# Patient Record
Sex: Male | Born: 1960 | Race: White | Hispanic: No | Marital: Married | Smoking: Current some day smoker
Health system: Southern US, Community
[De-identification: ages and names within clinical notes are randomized; demographics above are authoritative.]

## PROBLEM LIST (undated history)

## (undated) DIAGNOSIS — I714 Abdominal aortic aneurysm, without rupture, unspecified: Secondary | ICD-10-CM

## (undated) DIAGNOSIS — M51369 Other intervertebral disc degeneration, lumbar region without mention of lumbar back pain or lower extremity pain: Secondary | ICD-10-CM

## (undated) DIAGNOSIS — M5136 Other intervertebral disc degeneration, lumbar region: Secondary | ICD-10-CM

## (undated) DIAGNOSIS — M359 Systemic involvement of connective tissue, unspecified: Secondary | ICD-10-CM

## (undated) DIAGNOSIS — I10 Essential (primary) hypertension: Secondary | ICD-10-CM

## (undated) HISTORY — PX: OTHER SURGICAL HISTORY: SHX169

## (undated) HISTORY — PX: UMBILICAL HERNIA REPAIR: SHX196

---

## 2015-11-03 ENCOUNTER — Emergency Department (HOSPITAL_COMMUNITY): Payer: BLUE CROSS/BLUE SHIELD

## 2015-11-03 ENCOUNTER — Encounter (HOSPITAL_COMMUNITY): Payer: Self-pay | Admitting: *Deleted

## 2015-11-03 ENCOUNTER — Emergency Department (HOSPITAL_COMMUNITY)
Admission: EM | Admit: 2015-11-03 | Discharge: 2015-11-03 | Disposition: A | Discharge status: Home / Self Care | Payer: BLUE CROSS/BLUE SHIELD | Attending: Emergency Medicine | Admitting: Emergency Medicine

## 2015-11-03 DIAGNOSIS — Z880 Allergy status to penicillin: Secondary | ICD-10-CM | POA: Diagnosis not present

## 2015-11-03 DIAGNOSIS — F17200 Nicotine dependence, unspecified, uncomplicated: Secondary | ICD-10-CM | POA: Diagnosis not present

## 2015-11-03 DIAGNOSIS — B9789 Other viral agents as the cause of diseases classified elsewhere: Secondary | ICD-10-CM

## 2015-11-03 DIAGNOSIS — R062 Wheezing: Secondary | ICD-10-CM

## 2015-11-03 DIAGNOSIS — J069 Acute upper respiratory infection, unspecified: Secondary | ICD-10-CM | POA: Diagnosis not present

## 2015-11-03 DIAGNOSIS — R05 Cough: Secondary | ICD-10-CM | POA: Diagnosis present

## 2015-11-03 MED ORDER — BENZONATATE 100 MG PO CAPS
100.0000 mg | ORAL_CAPSULE | Freq: Once | ORAL | Status: AC
  Administered 2015-11-03: 100 mg via ORAL
  Filled 2015-11-03: qty 1

## 2015-11-03 MED ORDER — PREDNISONE 50 MG PO TABS
60.0000 mg | ORAL_TABLET | Freq: Once | ORAL | Status: AC
  Administered 2015-11-03: 60 mg via ORAL
  Filled 2015-11-03: qty 1

## 2015-11-03 MED ORDER — BENZONATATE 100 MG PO CAPS: 100.0000 mg | ORAL_CAPSULE | Freq: Three times a day (TID) | ORAL | Status: DC | PRN

## 2015-11-03 MED ORDER — IPRATROPIUM-ALBUTEROL 0.5-2.5 (3) MG/3ML IN SOLN
3.0000 mL | Freq: Once | RESPIRATORY_TRACT | Status: AC
  Administered 2015-11-03: 3 mL via RESPIRATORY_TRACT
  Filled 2015-11-03: qty 3

## 2015-11-03 MED ORDER — PREDNISONE 20 MG PO TABS: 60.0000 mg | ORAL_TABLET | Freq: Every day | ORAL | Status: DC

## 2015-11-03 MED ORDER — IBUPROFEN 800 MG PO TABS: 800.0000 mg | ORAL_TABLET | Freq: Three times a day (TID) | ORAL | Status: DC | PRN

## 2015-11-03 MED ORDER — ALBUTEROL SULFATE HFA 108 (90 BASE) MCG/ACT IN AERS: 2.0000 | INHALATION_SPRAY | RESPIRATORY_TRACT | Status: DC | PRN

## 2015-11-03 MED ORDER — IBUPROFEN 800 MG PO TABS
800.0000 mg | ORAL_TABLET | Freq: Once | ORAL | Status: AC
  Administered 2015-11-03: 800 mg via ORAL
  Filled 2015-11-03: qty 1

## 2015-11-03 NOTE — ED Notes (Signed)
Pt c/o fever, cough that is non productive that started two weeks ago,

## 2015-11-03 NOTE — ED Notes (Signed)
Patient verbalizes understanding of discharge instructions, prescription medications, home care and follow up care. Patient out of department at this time with family. 

## 2015-11-03 NOTE — ED Provider Notes (Signed)
  CHIEF COMPLAINT: subjective fevers, cough, body aches  HPI: Pt is a 55 y.o. male who has had 2 weeks of dry cough, subjective fevers. Was seen in urgent care and [facility] and prescribed antibiotics which he has completed. He also took hydrocodone cough syrup which he states made him feel "loopy". States she has had some wheezing but no chest pain or shortness of breath.  He denies any vomiting or diarrhea. No sick contacts or recent travel. Did not have influenza vaccination this year.     ROS: See HPI Constitutional:  fever  Eyes: no drainage  ENT: no runny nose   Cardiovascular:  no chest pain  Resp: no SOB  GI: no vomiting GU: no dysuria Integumentary: no rash  Allergy: no hives  Musculoskeletal: no leg swelling  Neurological: no slurred speech ROS otherwise negative  PAST MEDICAL HISTORY/PAST SURGICAL HISTORY:  History reviewed. No pertinent past medical history.  MEDICATIONS:  Prior to Admission medications   Not on File    ALLERGIES:  Allergies  Allergen Reactions  . Codeine   . Penicillins   . Sulfa Antibiotics     SOCIAL HISTORY:  Social History  Substance Use Topics  . Smoking status: Current Some Day Smoker  . Smokeless tobacco: Not on file  . Alcohol Use: Not on file    FAMILY HISTORY: No family history on file.  EXAM: BP 135/91 mmHg  Pulse 73  Temp(Src) 98.2 F (36.8 C) (Oral)  Resp 20  Ht  (1.803 m)  Wt 167 lb (75.751 kg)  BMI 23.30 kg/m2  SpO2 100% CONSTITUTIONAL: Alert and oriented and responds appropriately to questions. Well-appearing; well-nourished HEAD: Normocephalic EYES: Conjunctivae clear, PERRL ENT: normal nose; no rhinorrhea; moist mucous membranes; pharynx without lesions noted NECK: Supple, no meningismus, no LAD  CARD: RRR; S1 and S2 appreciated; no murmurs, no clicks, no rubs, no gallops RESP: Pt has scattered expiratory wheezing is diminished at his bases bilaterally. Normal chest excursion without splinting or  tachypnea; breath sounds  equal bilaterally;  no rhonchi, no rales, no hypoxia or respiratory distress, speaking full sentences ABD/GI: Normal bowel sounds; non-distended; soft, non-tender, no rebound, no guarding, no peritoneal signs BACK:  The back appears normal and is non-tender to palpation, there is no CVA tenderness EXT: Normal ROM in all joints; non-tender to palpation; no edema; normal capillary refill; no cyanosis, no calf tenderness or swelling    SKIN: Normal color for age and race; warm NEURO: Moves all extremities equally, sensation to light touch intact diffusely, cranial nerves II through XII intact PSYCH: The patient's mood and manner are appropriate. Grooming and personal hygiene are appropriate.  MEDICAL DECISION MAKING: patient here with flulike symptoms, suspect viral illness. Chest x-ray shows no infiltrate. Patient did have some expiratory wheezing and diminished aeration at his bases bilaterally that has improved after breathing treatment. I have also treated him symptomatically with Tessalon Perles, ibuprofen, prednisone. He reports feeling much better. No infiltrate on CXR to  suggest he needs to be on antibiotics. He is outside of treatment window for Tamiflu. Have recommended alternating Tylenol and Motrin for fever and pain. Recommended rest, increase fluid intake. Will discharge with albuterol inhaler as well as prednisone burst given he is a smoker and has been wheezing. Discussed return precautions. Provide with outpatient follow-up. He verbalized understanding and is comfortable with this plan.      Layla Maw Chavy Avera, DO 11/03/15 [id number]

## 2015-11-03 NOTE — Discharge Instructions (Signed)
Upper Respiratory Infection, Adult °Most upper respiratory infections (URIs) are a viral infection of the air passages leading to the lungs. A URI affects the nose, throat, and upper air passages. The most common type of URI is nasopharyngitis and is typically referred to as "the common cold." °URIs run their course and usually go away on their own. Most of the time, a URI does not require medical attention, but sometimes a bacterial infection in the upper airways can follow a viral infection. This is called a secondary infection. Sinus and middle ear infections are common types of secondary upper respiratory infections. °Bacterial pneumonia can also complicate a URI. A URI can worsen asthma and chronic obstructive pulmonary disease (COPD). Sometimes, these complications can require emergency medical care and may be life threatening.  °CAUSES °Almost all URIs are caused by viruses. A virus is a type of germ and can spread from one person to another.  °RISKS FACTORS °You may be at risk for a URI if:  °1. You smoke.   °2. You have chronic heart or lung disease. °3. You have a weakened defense (immune) system.   °4. You are very young or very old.   °5. You have nasal allergies or asthma. °6. You work in crowded or poorly ventilated areas. °7. You work in health care facilities or schools. °SIGNS AND SYMPTOMS  °Symptoms typically develop 2-3 days after you come in contact with a cold virus. Most viral URIs last 7-10 days. However, viral URIs from the influenza virus (flu virus) can last 14-18 days and are typically more severe. Symptoms may include:  °1. Runny or stuffy (congested) nose.   °2. Sneezing.   °3. Cough.   °4. Sore throat.   °5. Headache.   °6. Fatigue.   °7. Fever.   °8. Loss of appetite.   °9. Pain in your forehead, behind your eyes, and over your cheekbones (sinus pain). °10. Muscle aches.   °DIAGNOSIS  °Your health care provider may diagnose a URI by: °· Physical exam. °· Tests to check that your  symptoms are not due to another condition such as: °· Strep throat. °· Sinusitis. °· Pneumonia. °· Asthma. °TREATMENT  °A URI goes away on its own with time. It cannot be cured with medicines, but medicines may be prescribed or recommended to relieve symptoms. Medicines may help: °· Reduce your fever. °· Reduce your cough. °· Relieve nasal congestion. °HOME CARE INSTRUCTIONS  °· Take medicines only as directed by your health care provider.   °· Gargle warm saltwater or take cough drops to comfort your throat as directed by your health care provider. °· Use a warm mist humidifier or inhale steam from a shower to increase air moisture. This may make it easier to breathe. °· Drink enough fluid to keep your urine clear or pale yellow.   °· Eat soups and other clear broths and maintain good nutrition.   °· Rest as needed.   °· Return to work when your temperature has returned to normal or as your health care provider advises. You may need to stay home longer to avoid infecting others. You can also use a face mask and careful hand washing to prevent spread of the virus. °· Increase the usage of your inhaler if you have asthma.   °· Do not use any tobacco products, including cigarettes, chewing tobacco, or electronic cigarettes. If you need help quitting, ask your health care provider. °PREVENTION  °The best way to protect yourself from getting a cold is to practice good hygiene.  °· Avoid oral or hand contact with people with cold   symptoms.   °· Wash your hands often if contact occurs.   °There is no clear evidence that vitamin C, vitamin E, echinacea, or exercise reduces the chance of developing a cold. However, it is always recommended to get plenty of rest, exercise, and practice good nutrition.  °SEEK MEDICAL CARE IF:  °· You are getting worse rather than better.   °· Your symptoms are not controlled by medicine.   °· You have chills. °· You have worsening shortness of breath. °· You have brown or red mucus. °· You  have yellow or brown nasal discharge. °· You have pain in your face, especially when you bend forward. °· You have a fever. °· You have swollen neck glands. °· You have pain while swallowing. °· You have white areas in the back of your throat. °SEEK IMMEDIATE MEDICAL CARE IF:  °· You have severe or persistent: °¨ Headache. °¨ Ear pain. °¨ Sinus pain. °¨ Chest pain. °· You have chronic lung disease and any of the following: °¨ Wheezing. °¨ Prolonged cough. °¨ Coughing up blood. °¨ A change in your usual mucus. °· You have a stiff neck. °· You have changes in your: °¨ Vision. °¨ Hearing. °¨ Thinking. °¨ Mood. °MAKE SURE YOU:  °· Understand these instructions. °· Will watch your condition. °· Will get help right away if you are not doing well or get worse. °  °This information is not intended to replace advice given to you by your health care provider. Make sure you discuss any questions you have with your health care provider. °  °Document Released: 02/24/2001 Document Revised: 01/15/2015 Document Reviewed: 12/06/2013 °Elsevier Interactive Patient Education ©2016 [employer] ° °How to Use an Inhaler °Proper inhaler technique is very important. Good technique ensures that the medicine reaches the lungs. Poor technique results in depositing the medicine on the tongue and back of the throat rather than in the airways. If you do not use the inhaler with good technique, the medicine will not help you. °STEPS TO FOLLOW IF USING AN INHALER WITHOUT AN EXTENSION TUBE °8. Remove the cap from the inhaler. °9. If you are using the inhaler for the first time, you will need to prime it. Shake the inhaler for 5 seconds and release four puffs into the air, away from your face. Ask your health care provider or pharmacist if you have questions about priming your inhaler. °10. Shake the inhaler for 5 seconds before each breath in (inhalation). °11. Position the inhaler so that the top of the canister faces up. °12. Put your index  finger on the top of the medicine canister. Your thumb supports the bottom of the inhaler. °13. Open your mouth. °14. Either place the inhaler between your teeth and place your lips tightly around the mouthpiece, or hold the inhaler 1-2 inches away from your open mouth. If you are unsure of which technique to use, ask your health care provider. °15. Breathe out (exhale) normally and as completely as possible. °16. Press the canister down with your index finger to release the medicine. °17. At the same time as the canister is pressed, inhale deeply and slowly until your lungs are completely filled. This should take 4-6 seconds. Keep your tongue down. °18. Hold the medicine in your lungs for 5-10 seconds (10 seconds is best). This helps the medicine get into the small airways of your lungs. °19. Breathe out slowly, through pursed lips. Whistling is an example of pursed lips. °20. Wait at least 15-30 seconds between puffs. Continue   with the above steps until you have taken the number of puffs your health care provider has ordered. Do not use the inhaler more than your health care provider tells you. °21. Replace the cap on the inhaler. °22. Follow the directions from your health care provider or the inhaler insert for cleaning the inhaler. °STEPS TO FOLLOW IF USING AN INHALER WITH AN EXTENSION (SPACER) °11. Remove the cap from the inhaler. °12. If you are using the inhaler for the first time, you will need to prime it. Shake the inhaler for 5 seconds and release four puffs into the air, away from your face. Ask your health care provider or pharmacist if you have questions about priming your inhaler. °13. Shake the inhaler for 5 seconds before each breath in (inhalation). °14. Place the open end of the spacer onto the mouthpiece of the inhaler. °15. Position the inhaler so that the top of the canister faces up and the spacer mouthpiece faces you. °16. Put your index finger on the top of the medicine canister. Your thumb  supports the bottom of the inhaler and the spacer. °17. Breathe out (exhale) normally and as completely as possible. °18. Immediately after exhaling, place the spacer between your teeth and into your mouth. Close your lips tightly around the spacer. °19. Press the canister down with your index finger to release the medicine. °20. At the same time as the canister is pressed, inhale deeply and slowly until your lungs are completely filled. This should take 4-6 seconds. Keep your tongue down and out of the way. °21. Hold the medicine in your lungs for 5-10 seconds (10 seconds is best). This helps the medicine get into the small airways of your lungs. Exhale. °22. Repeat inhaling deeply through the spacer mouthpiece. Again hold that breath for up to 10 seconds (10 seconds is best). Exhale slowly. If it is difficult to take this second deep breath through the spacer, breathe normally several times through the spacer. Remove the spacer from your mouth. °23. Wait at least 15-30 seconds between puffs. Continue with the above steps until you have taken the number of puffs your health care provider has ordered. Do not use the inhaler more than your health care provider tells you. °24. Remove the spacer from the inhaler, and place the cap on the inhaler. °25. Follow the directions from your health care provider or the inhaler insert for cleaning the inhaler and spacer. °If you are using different kinds of inhalers, use your quick relief medicine to open the airways 10-15 minutes before using a steroid if instructed to do so by your health care provider. If you are unsure which inhalers to use and the order of using them, ask your health care provider, nurse, or respiratory therapist. °If you are using a steroid inhaler, always rinse your mouth with water after your last puff, then gargle and spit out the water. Do not swallow the water. °AVOID: °· Inhaling before or after starting the spray of medicine. It takes practice to  coordinate your breathing with triggering the spray. °· Inhaling through the nose (rather than the mouth) when triggering the spray. °HOW TO DETERMINE IF YOUR INHALER IS FULL OR NEARLY EMPTY °You cannot know when an inhaler is empty by shaking it. A few inhalers are now being made with dose counters. Ask your health care provider for a prescription that has a dose counter if you feel you need that extra help. If your inhaler does not have a counter,   ask your health care provider to help you determine the date you need to refill your inhaler. Write the refill date on a calendar or your inhaler canister. Refill your inhaler 7-10 days before it runs out. Be sure to keep an adequate supply of medicine. This includes making sure it is not expired, and that you have a spare inhaler.  °SEEK MEDICAL CARE IF:  °· Your symptoms are only partially relieved with your inhaler. °· You are having trouble using your inhaler. °· You have some increase in phlegm. °SEEK IMMEDIATE MEDICAL CARE IF:  °· You feel little or no relief with your inhalers. You are still wheezing and are feeling shortness of breath or tightness in your chest or both. °· You have dizziness, headaches, or a fast heart rate. °· You have chills, fever, or night sweats. °· You have a noticeable increase in phlegm production, or there is blood in the phlegm. °MAKE SURE YOU:  °· Understand these instructions. °· Will watch your condition. °· Will get help right away if you are not doing well or get worse. °  °This information is not intended to replace advice given to you by your health care provider. Make sure you discuss any questions you have with your health care provider. °  °Document Released: 08/28/2000 Document Revised: 06/21/2013 Document Reviewed: 03/30/2013 °Elsevier Interactive Patient Education ©2016 [employer] ° °

## 2015-11-28 ENCOUNTER — Encounter (HOSPITAL_COMMUNITY): Payer: Self-pay

## 2015-11-28 ENCOUNTER — Ambulatory Visit (HOSPITAL_COMMUNITY)
Admission: RE | Admit: 2015-11-28 | Discharge: 2015-11-28 | Disposition: A | Discharge status: Home / Self Care | Payer: BLUE CROSS/BLUE SHIELD | Source: Ambulatory Visit | Attending: Family Medicine | Admitting: Family Medicine

## 2015-11-28 ENCOUNTER — Emergency Department (HOSPITAL_COMMUNITY): Payer: BLUE CROSS/BLUE SHIELD

## 2015-11-28 ENCOUNTER — Other Ambulatory Visit: Payer: Self-pay

## 2015-11-28 ENCOUNTER — Other Ambulatory Visit (HOSPITAL_COMMUNITY): Payer: Self-pay | Admitting: Family Medicine

## 2015-11-28 DIAGNOSIS — I714 Abdominal aortic aneurysm, without rupture: Secondary | ICD-10-CM | POA: Insufficient documentation

## 2015-11-28 DIAGNOSIS — M5431 Sciatica, right side: Secondary | ICD-10-CM

## 2015-11-28 DIAGNOSIS — M5432 Sciatica, left side: Principal | ICD-10-CM

## 2015-11-28 DIAGNOSIS — J3489 Other specified disorders of nose and nasal sinuses: Secondary | ICD-10-CM | POA: Diagnosis not present

## 2015-11-28 DIAGNOSIS — R059 Cough, unspecified: Secondary | ICD-10-CM

## 2015-11-28 DIAGNOSIS — Z79899 Other long term (current) drug therapy: Secondary | ICD-10-CM | POA: Diagnosis not present

## 2015-11-28 DIAGNOSIS — I10 Essential (primary) hypertension: Secondary | ICD-10-CM | POA: Insufficient documentation

## 2015-11-28 DIAGNOSIS — F17200 Nicotine dependence, unspecified, uncomplicated: Secondary | ICD-10-CM | POA: Insufficient documentation

## 2015-11-28 DIAGNOSIS — R51 Headache: Secondary | ICD-10-CM | POA: Insufficient documentation

## 2015-11-28 DIAGNOSIS — R05 Cough: Secondary | ICD-10-CM

## 2015-11-28 DIAGNOSIS — Z8739 Personal history of other diseases of the musculoskeletal system and connective tissue: Secondary | ICD-10-CM | POA: Insufficient documentation

## 2015-11-28 DIAGNOSIS — Z7982 Long term (current) use of aspirin: Secondary | ICD-10-CM | POA: Insufficient documentation

## 2015-11-28 DIAGNOSIS — R1031 Right lower quadrant pain: Secondary | ICD-10-CM | POA: Diagnosis present

## 2015-11-28 DIAGNOSIS — M479 Spondylosis, unspecified: Secondary | ICD-10-CM | POA: Insufficient documentation

## 2015-11-28 DIAGNOSIS — Z880 Allergy status to penicillin: Secondary | ICD-10-CM | POA: Insufficient documentation

## 2015-11-28 DIAGNOSIS — E785 Hyperlipidemia, unspecified: Secondary | ICD-10-CM | POA: Diagnosis not present

## 2015-11-28 LAB — BASIC METABOLIC PANEL
Anion gap: 12 (ref 5–15)
BUN: 13 mg/dL (ref 6–20)
CHLORIDE: 104 mmol/L (ref 101–111)
CO2: 23 mmol/L (ref 22–32)
CREATININE: 1.05 mg/dL (ref 0.61–1.24)
Calcium: 9.4 mg/dL (ref 8.9–10.3)
GFR calc Af Amer: >60 mL/min (ref >60)
Glucose, Bld: 116 mg/dL — ABNORMAL HIGH (ref 65–99)
Potassium: 3.5 mmol/L (ref 3.5–5.1)
SODIUM: 139 mmol/L (ref 135–145)

## 2015-11-28 LAB — CBC
HCT: 40.2 % (ref 39.0–52.0)
HEMOGLOBIN: 13.2 g/dL (ref 13.0–17.0)
MCH: 30.9 pg (ref 26.0–34.0)
MCHC: 32.8 g/dL (ref 30.0–36.0)
MCV: 94.1 fL (ref 78.0–100.0)
Platelets: 315 10*3/uL (ref 150–400)
RBC: 4.27 MIL/uL (ref 4.22–5.81)
RDW: 13.1 % (ref 11.5–15.5)
WBC: 9.4 10*3/uL (ref 4.0–10.5)

## 2015-11-28 LAB — I-STAT TROPONIN, ED: TROPONIN I, POC: 0 ng/mL (ref 0.00–0.08)

## 2015-11-28 NOTE — ED Notes (Signed)
Pt reports pain to right pelvic area that radiates down right leg. 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 This RN wonders if he is referring to a possible AAA but pt is poor historian. Pt denies abdominal or back pain and denies diaphoresis or lightheadedness. BP in left arm 163/103 and BP in right arm 133/86. BP in left arm rechecked and is 145/98.

## 2015-11-29 ENCOUNTER — Emergency Department (HOSPITAL_COMMUNITY): Payer: BLUE CROSS/BLUE SHIELD

## 2015-11-29 ENCOUNTER — Encounter (HOSPITAL_COMMUNITY): Payer: Self-pay | Admitting: Radiology

## 2015-11-29 ENCOUNTER — Emergency Department (HOSPITAL_COMMUNITY)
Admission: EM | Admit: 2015-11-29 | Discharge: 2015-11-29 | Disposition: A | Discharge status: Home / Self Care | Payer: BLUE CROSS/BLUE SHIELD | Attending: Emergency Medicine | Admitting: Emergency Medicine

## 2015-11-29 DIAGNOSIS — R1031 Right lower quadrant pain: Secondary | ICD-10-CM

## 2015-11-29 DIAGNOSIS — I714 Abdominal aortic aneurysm, without rupture, unspecified: Secondary | ICD-10-CM

## 2015-11-29 HISTORY — DX: Essential (primary) hypertension: I10

## 2015-11-29 HISTORY — DX: Systemic involvement of connective tissue, unspecified: M35.9

## 2015-11-29 MED ORDER — IOHEXOL 350 MG/ML SOLN
100.0000 mL | Freq: Once | INTRAVENOUS | Status: AC | PRN
  Administered 2015-11-29: 100 mL via INTRAVENOUS

## 2015-11-29 MED ORDER — IOHEXOL 300 MG/ML  SOLN: 100.0000 mL | Freq: Once | INTRAMUSCULAR | Status: DC | PRN

## 2015-11-29 MED ORDER — PROCHLORPERAZINE EDISYLATE 5 MG/ML IJ SOLN
10.0000 mg | Freq: Four times a day (QID) | INTRAMUSCULAR | Status: DC | PRN
  Administered 2015-11-29: 10 mg via INTRAVENOUS
  Filled 2015-11-29: qty 2

## 2015-11-29 MED ORDER — DIPHENHYDRAMINE HCL 50 MG/ML IJ SOLN
25.0000 mg | Freq: Once | INTRAMUSCULAR | Status: AC
  Administered 2015-11-29: 25 mg via INTRAVENOUS
  Filled 2015-11-29: qty 1

## 2015-11-29 NOTE — ED Notes (Signed)
Discharge instructions reviewed - voiced understanding 

## 2015-11-29 NOTE — ED Notes (Signed)
Patient ambulatory from the waiting area.  States he has been having pain in his right groin for several hours.  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

## 2015-11-29 NOTE — Discharge Instructions (Signed)
Blood pumps away from the heart through tubes (blood vessels) called arteries. Aneurysms are weak or damaged places in the wall of an artery. It bulges out like a balloon. An abdominal aortic aneurysm happens in the main artery of the body (aorta). It can burst or tear, causing bleeding inside the body. This is an emergency. It needs treatment right away. °CAUSES  °The exact cause is unknown. Things that could cause this problem include: °· Fat and other substances building up in the lining of a tube. °· Swelling of the walls of a blood vessel. °· Certain tissue diseases. °· Belly (abdominal) trauma. °· An infection in the main artery of the body. °RISK FACTORS °There are things that make it more likely for you to have an aneurysm. These include: °· Being over the age of 57 years old. °· Having high blood pressure (hypertension). °· Being a male. °· Being white. °· Being very overweight (obese). °· Having a family history of aneurysm. °· Using tobacco products. °PREVENTION °To lessen your chance of getting this condition: °· Stop smoking. Stop chewing tobacco. °· Limit or avoid alcohol. °· Keep your blood pressure, blood sugar, and cholesterol within normal limits. °· Eat less salt. °· Eat foods low in saturated fats and cholesterol. These are found in animal and whole dairy products.  °· Eat more fiber. Fiber is found in whole grains, vegetables, and fruits. °· Keep a healthy weight. °· Stay active and exercise often. °SYMPTOMS °Symptoms depend on the size of the aneurysm and how fast it grows. There may not be symptoms. If symptoms occur, they can include: °· Pain (belly, side, lower back, or groin). °· Feeling full after eating a small amount of food. °· Feeling sick to your stomach (nauseous), throwing up (vomiting), or both. °· Feeling a lump in your belly that feels like it is beating (pulsating). °· Feeling like you will pass out (faint). °TREATMENT  °· Medicine to control blood pressure and pain. °· Imaging  tests to see if the aneurysm gets bigger. °· Surgery. °MAKE SURE YOU:  °· Understand these instructions. °· Will watch your condition. °· Will get help right away if you are not doing well or get worse. °  °This information is not intended to replace advice given to you by your health care provider. Make sure you discuss any questions you have with your health care provider. °  °Document Released: 12/26/2012 Document Reviewed: 12/26/2012 °Elsevier Interactive Patient Education ©2016 [employer] ° °

## 2015-11-29 NOTE — ED Provider Notes (Signed)
CSN: [id number]     Arrival date & time 11/28/15  2047 History  By signing my name below, I, Freida Busman, attest that this documentation has been prepared under the direction and in the presence of Alvira Monday, MD . Electronically Signed: Freida Busman, Scribe. 11/29/2015. 2:54 AM.      Chief Complaint  Patient presents with  . Leg Pain    Patient is a 55 y.o. male presenting with leg pain. The history is provided by the patient. No language interpreter was used.  Leg Pain Location:  Hip and leg Injury: no   Hip location:  R hip Pain details:    Quality:  Throbbing   Timing:  Intermittent Chronicity:  Chronic Dislocation: no   Prior injury to area:  No Worsened by:  Bearing weight Associated symptoms: no back pain, no fever, no muscle weakness and no numbness   Risk factors: no frequent fractures     HPI Comments:  Jerry Gill is a 55 y.o. male with a history of  HTN and HLD, who presents to the Emergency Department complaining of RLE pain x "years" but states RLE began throbbing tonight. He reports pain from his right groin to hip and occasionally down to his right ankle.  Pt was advised to come to the ED by PCP secondary to past history of aortic aneurysm. Pt denies recent injury/fall. He also denies back pain, bowel/bladder incontinence, LE weakness, abdominal pain, CP, SOB, fever, chills, dysuria, nausea and vomiting . No alleviating factors noted. Pt is a current smoker.   Pt also notes gradual onset HA that he woke up with ~ 2-3 days ago with associated nasal congestion and sinus pressure.   Past Medical History  Diagnosis Date  . Collagen vascular disease (HCC)   . Hypertension    History reviewed. No pertinent past surgical history. No family history on file. Social History  Substance Use Topics  . Smoking status: Current Some Day Smoker  . Smokeless tobacco: None  . Alcohol Use: None    Review of Systems  Constitutional: Negative for fever and chills.   HENT: Positive for congestion and sinus pressure.   Respiratory: Negative for shortness of breath.   Cardiovascular: Negative for chest pain.  Gastrointestinal: Negative for nausea, vomiting and abdominal pain.  Genitourinary: Negative for dysuria.  Musculoskeletal: Positive for myalgias (RLE). Negative for back pain.  Neurological: Positive for headaches. Negative for weakness and numbness.  All other systems reviewed and are negative.   Allergies  Codeine; Sulfa antibiotics; and Penicillins  Home Medications   Prior to Admission medications   Medication Sig Start Date End Date Taking? Authorizing Provider  albuterol (PROVENTIL HFA;VENTOLIN HFA) 108 (90 Base) MCG/ACT inhaler Inhale 2 puffs into the lungs every 4 (four) hours as needed for wheezing or shortness of breath. 11/03/15  Yes Kristen N Ward, DO  aspirin EC 81 MG tablet Take 81 mg by mouth every evening.   Yes Historical Provider, MD  benzonatate (TESSALON) 100 MG capsule Take 1 capsule (100 mg total) by mouth 3 (three) times daily as needed for cough. 11/03/15  Yes Kristen N Ward, DO  fenofibrate micronized (LOFIBRA) 200 MG capsule Take 200 mg by mouth every evening.   Yes Historical Provider, MD  ferrous sulfate 325 (65 FE) MG tablet Take 325 mg by mouth daily with breakfast.   Yes Historical Provider, MD  ibuprofen (ADVIL,MOTRIN) 800 MG tablet Take 1 tablet (800 mg total) by mouth every 8 (eight) hours as needed for  mild pain. 11/03/15  Yes Kristen N Ward, DO  metoprolol succinate (TOPROL-XL) 100 MG 24 hr tablet Take 150 mg by mouth daily. Take with or immediately following a meal.   Yes Historical Provider, MD  Omega-3 Fatty Acids (FISH OIL TRIPLE STRENGTH) 1400 MG CAPS Take 1 tablet by mouth daily.   Yes Historical Provider, MD   BP 135/92 mmHg  Pulse 65  Temp(Src) 98.9 F (37.2 C)  Resp 12  SpO2 98% Physical Exam  Constitutional: He is oriented to person, place, and time. He appears well-developed and well-nourished. No  distress.  HENT:  Head: Normocephalic and atraumatic.  Eyes: Conjunctivae and EOM are normal.  Neck: Normal range of motion.  Cardiovascular: Normal rate, regular rhythm, normal heart sounds and intact distal pulses.  Exam reveals no gallop and no friction rub.   No murmur heard. Pulmonary/Chest: Effort normal and breath sounds normal. No respiratory distress. He has no wheezes. He has no rales.  Abdominal: Soft. He exhibits no distension. There is tenderness (RLQ). There is no guarding.  Musculoskeletal: He exhibits no edema.       Lumbar back: He exhibits bony tenderness.  Neurological: He is alert and oriented to person, place, and time. He has normal strength. No sensory deficit. GCS eye subscore is 4. GCS verbal subscore is 5. GCS motor subscore is 6.  Skin: Skin is warm and dry. He is not diaphoretic.  Nursing note and vitals reviewed.   ED Course  Procedures  DIAGNOSTIC STUDIES:  Oxygen Saturation is 98% on RA, normal by my interpretation.    COORDINATION OF CARE:  2:39 AM Pt updated with results. Discussed treatment plan with pt at bedside and pt agreed to plan.  Labs Review Labs Reviewed  BASIC METABOLIC PANEL - Abnormal; Notable for the following:    Glucose, Bld 116 (*)    All other components within normal limits  CBC  I-STAT TROPOININ, ED    Imaging Review Dg Chest 2 View  11/28/2015  CLINICAL DATA:  History of aneurysm. Pelvic pain. Initial encounter. EXAM: CHEST  2 VIEW COMPARISON:  Earlier today FINDINGS: The heart size and mediastinal contours are within normal limits. Both lungs are clear. The visualized skeletal structures are unremarkable. IMPRESSION: No active cardiopulmonary disease. Electronically Signed   By: Marnee Spring M.D.   On: 11/28/2015 21:36   Dg Chest 2 View  11/28/2015  CLINICAL DATA:  Cough and right-sided chest pain, chronic EXAM: CHEST  2 VIEW COMPARISON:  November 03, 2015 FINDINGS: There is no edema or consolidation. The heart size and  pulmonary vascularity are normal. No adenopathy. No bone lesions. No pneumothorax. IMPRESSION: No edema or consolidation. Electronically Signed   By: Bretta Bang III M.D.   On: 11/28/2015 16:36   Dg Lumbar Spine Complete  11/28/2015  CLINICAL DATA:  Right side low back pain for months. No known injury. Initial encounter. EXAM: LUMBAR SPINE - COMPLETE 4+ VIEW COMPARISON:  PA and lateral chest 11/03/2015. FINDINGS: There is convex right scoliosis. No fracture is identified. Mild anterolisthesis L3 on L4 is seen. There is severe loss of disc space height at L3-4, L4-5 and L5-S1. Facet arthropathy is also seen at these levels. Paraspinous structures demonstrate an abdominal aortic aneurysm measuring approximately 5.8 cm in diameter. IMPRESSION: Large abdominal aortic aneurysm measures approximately 5.8 cm in diameter. CT angiogram of the abdomen and pelvis is recommended for further evaluation. Severe lower lumbar spondylosis. These results will be called to the ordering clinician or representative  by the Radiologist Assistant, and communication documented in the PACS or zVision Dashboard. Electronically Signed   By: Drusilla Kannerhomas  Dalessio M.D.   On: 11/28/2015 16:38   Ct Cta Abd/pel W/cm &/or W/o Cm  11/29/2015  CLINICAL DATA:  Right groin pain. Abdominal aortic aneurysm seen on radiography. EXAM: CTA ABDOMEN AND PELVIS wITHOUT AND WITH CONTRAST TECHNIQUE: Multidetector CT imaging of the abdomen and pelvis was performed using the standard protocol during bolus administration of intravenous contrast. Multiplanar reconstructed images and MIPs were obtained and reviewed to evaluate the vascular anatomy. CONTRAST:  100mL OMNIPAQUE IOHEXOL 350 MG/ML SOLN COMPARISON:  None. FINDINGS: Lower chest and abdominal wall: Age advanced coronary atherosclerosis seen in the LAD. Hepatobiliary: No focal liver abnormality.No evidence of biliary obstruction or stone. Pancreas: Unremarkable. Spleen: Unremarkable. Adrenals/Urinary  Tract: Negative adrenals. No hydronephrosis or stone. Unremarkable bladder. Reproductive:No pathologic findings. Stomach/Bowel:  No obstruction. No appendicitis. Vascular/Lymphatic: 53 x 50 mm fusiform infrarenal aortic aneurysm with irregular peripheral mural thrombus. There is no retroperitoneal hematoma, discontinuous intimal calcification, or aortic draping. No continuation into the common iliacs. Extensive bilateral iliac atherosclerosis with moderate right external stenosis. Other than a tiny left accessory renal artery aortic branching is standard. No notable mesenteric stenosis. No mass or adenopathy. Peritoneal: Dystrophic egg shell calcification in the low left pelvis. Musculoskeletal: No acute finding. Advanced lumbar disc and facet degeneration from L3-4 to the sacrum, with rightward L3-4 translation. Review of the MIP images confirms the above findings. IMPRESSION: 1. No acute intra-abdominal finding. 2. 53 mm fusiform infrarenal aortic aneurysm. Recommend followup by abdomen and pelvis CTA in 3-6 months, and vascular surgery referral/consultation if not already obtained. This recommendation follows ACR consensus guidelines: White Paper of the ACR Incidental Findings Committee II on Vascular Findings. J Am Coll Radiol 2013; 10:789-794. Electronically Signed   By: Marnee SpringJonathon  Watts M.D.   On: 11/29/2015 04:19   I have personally reviewed and evaluated these images and lab results as part of my medical decision-making.   EKG Interpretation   Date/Time:  Thursday November 28 2015 21:14:06 EDT Ventricular Rate:  79 PR Interval:  154 QRS Duration: 88 QT Interval:  366 QTC Calculation: 419 R Axis:   76 Text Interpretation:  Normal sinus rhythm Normal ECG No previous ECGs  available Reconfirmed by [facility] MD, Kaeden Depaz ([id number]) on 11/29/2015 7:46:08  PM      MDM   Will order CTA A/P.  Final diagnoses:  Abdominal aortic aneurysm (AAA) without rupture (HCC)  Right groin pain   54yo male with  history of smoking presents with concern for right groin pain.  Patient reports history of right sided pain radiating to hip and sometimes down leg for years, however it became worse. [facility] he had XR obtained which showed greater than 5cm AAA.  PCP saw results and recommended patient come to ED for evaluation.  Given some tenderness on exam and need for CT for vascular surgery, ordered CTA abd/pelvis which showed no acute pathology, and 5.3cm AAA.  CT also shows degenerative disc disease of lumbar spine, and given description of some radicular symptoms, feel this may be responsible for patient's right groin and leg pain.  Provided number for Dr. Imogene Burnhen, vascular surgery follow up and recommended smoking cessation.  Recommend tylenol/ibuprofen/heat/ice for groin pain. Patient discharged in stable condition with understanding of reasons to return.   I personally performed the services described in this documentation, which was scribed in my presence. The recorded information has been  reviewed and is accurate.    Alvira Monday, MD 11/29/15 [phone]

## 2015-11-29 NOTE — ED Notes (Signed)
Patient transported to CT 

## 2015-11-29 NOTE — ED Notes (Signed)
Patient painful upon palpation to the right groin.  Denies pan to the left groin or abd

## 2015-12-02 ENCOUNTER — Encounter: Payer: Self-pay | Admitting: Vascular Surgery

## 2015-12-03 ENCOUNTER — Other Ambulatory Visit: Payer: Self-pay

## 2015-12-03 ENCOUNTER — Encounter: Payer: Self-pay | Admitting: Vascular Surgery

## 2015-12-03 ENCOUNTER — Ambulatory Visit (INDEPENDENT_AMBULATORY_CARE_PROVIDER_SITE_OTHER): Payer: BLUE CROSS/BLUE SHIELD | Admitting: Vascular Surgery

## 2015-12-03 VITALS — BP 134/90 | HR 66 | Ht 71.0 in | Wt 165.0 lb

## 2015-12-03 DIAGNOSIS — I714 Abdominal aortic aneurysm, without rupture, unspecified: Secondary | ICD-10-CM

## 2015-12-03 DIAGNOSIS — Z01810 Encounter for preprocedural cardiovascular examination: Secondary | ICD-10-CM | POA: Diagnosis not present

## 2015-12-03 NOTE — Progress Notes (Signed)
[facility]  Patient name: Jerry Gill MRN: [id number] DOB: 11/22/1960 Sex: male  REASON FOR CONSULT: Evaluation of abdominal aortic aneurysm  HPI: Jerry Gill is a 55 y.o. male, who is seen today for recent diagnosis of abdominal aortic aneurysm. He was having back and flank discomfort and underwent plain films which suggested a large infrarenal abdominal aortic aneurysm. He underwent CT scan for further evaluation of this. This showed a 5.3 cm infrarenal abdominal aortic aneurysm with no evidence of rupture. His back and flank discomfort. To be musculoskeletal. The patient had no known history of aneurysm. Does have a history of aneurysm in his father and had open repair many years ago with no untoward consequences. Also had coronary bypass grafting for his aneurysm repair. The patient does not have any prior history of cardiac disease. He does have a long history of cigarette smoking. On questioning him he does have very clear-cut Claudication bilaterally. No history of rest pain or tissue loss. He does have COPD related to his of long 2 pack per day cigarette smoking. Does occasionally use a inhaler and this had a recent 2 months of respiratory issues that he reports is a cold.  Past Medical History  Diagnosis Date  . Collagen vascular disease (HCC)   . Hypertension     Family History  Problem Relation Age of Onset  . Heart disease Mother     before age 57  . Heart disease Father     before age 57  . AAA (abdominal aortic aneurysm) Father     SOCIAL HISTORY: Social History   Social History  . Marital Status: Married    Spouse Name: N/A  . Number of Children: N/A  . Years of Education: N/A   Occupational History  . Not on file.   Social History Main Topics  . Smoking status: Current Some Day Smoker -- 2.00 packs/day for 47 years    Types: Cigarettes  . Smokeless tobacco: Not on file  . Alcohol Use: No  . Drug Use: No  . Sexual Activity:  Not on file   Other Topics Concern  . Not on file   Social History Narrative    Allergies  Allergen Reactions  . Codeine Nausea And Vomiting  . Sulfa Antibiotics Itching  . Penicillins Itching and Rash    Current Outpatient Prescriptions  Medication Sig Dispense Refill  . albuterol (PROVENTIL HFA;VENTOLIN HFA) 108 (90 Base) MCG/ACT inhaler Inhale 2 puffs into the lungs every 4 (four) hours as needed for wheezing or shortness of breath. 1 Inhaler 0  . aspirin EC 81 MG tablet Take 81 mg by mouth every evening.    . benzonatate (TESSALON) 100 MG capsule Take 1 capsule (100 mg total) by mouth 3 (three) times daily as needed for cough. 30 capsule 0  . fenofibrate micronized (LOFIBRA) 200 MG capsule Take 200 mg by mouth every evening.    . ferrous sulfate 325 (65 FE) MG tablet Take 325 mg by mouth daily with breakfast.    . ibuprofen (ADVIL,MOTRIN) 800 MG tablet Take 1 tablet (800 mg total) by mouth every 8 (eight) hours as needed for mild pain. 30 tablet 0  . metoprolol succinate (TOPROL-XL) 100 MG 24 hr tablet Take 150 mg by mouth daily. Take with or immediately following a meal.    . Omega-3 Fatty Acids (FISH OIL TRIPLE STRENGTH) 1400 MG CAPS Take 1 tablet by mouth daily.     No current facility-administered medications  for this visit.    REVIEW OF SYSTEMS:  [X]  denotes positive finding, [ ]  denotes negative finding Cardiac  Comments:  Chest pain or chest pressure:    Shortness of breath upon exertion:    Short of breath when lying flat:    Irregular heart rhythm:        Vascular    Pain in calf, thigh, or hip brought on by ambulation:    Pain in feet at night that wakes you up from your sleep:     Blood clot in your veins:    Leg swelling:         Pulmonary    Oxygen at home:    Productive cough:     Wheezing:         Neurologic    Sudden weakness in arms or legs:     Sudden numbness in arms or legs:     Sudden onset of difficulty speaking or slurred speech:      Temporary loss of vision in one eye:     Problems with dizziness:         Gastrointestinal    Blood in stool:     Vomited blood:         Genitourinary    Burning when urinating:     Blood in urine:        Psychiatric    Major depression:         Hematologic    Bleeding problems:    Problems with blood clotting too easily:        Skin    Rashes or ulcers:        Constitutional    Fever or chills:      PHYSICAL EXAM: Filed Vitals:   12/03/15 0831  BP: 134/90  Pulse: 66  Height: 5\' 11"  (1.803 m)  Weight: 165 lb (74.844 kg)  SpO2: 100%    GENERAL: The patient is a well-nourished male, in no acute distress. The vital signs are documented above. CARDIAC: There is a regular rate and rhythm.  VASCULAR: 2+ radial and femoral pulses bilaterally. I do not palpate pedal pulses PULMONARY: There is good air exchange bilaterally without wheezing or rales. ABDOMEN: Soft and non-tender with normal pitched bowel sounds. Easily palpable aneurysm which is nontender MUSCULOSKELETAL: There are no major deformities or cyanosis. NEUROLOGIC: No focal weakness or paresthesias are detected. SKIN: There are no ulcers or rashes noted. PSYCHIATRIC: The patient has a normal affect.  DATA:  I reviewed his CT scan from 11/28/2015 and discussed at length with the patient. He does have an infrarenal abdominal aortic aneurysm. He does have an adequate infrarenal neck for stent graft repair. His iliac arteries are ectatic bilateral but become normal caliber above the iliac bifurcations. He does have some plaque in his iliac arteries bilaterally but does not appear to have any difficulty regarding access for stent graft grafting.   Impression and plan I had an extensive discussion with the patient and his wife regarding recommendation for elective repair. Also explain the option of open surgical repair versus stent graft repair. He does appear to have favorable anatomy for stent graft. I did explain  lifelong surveillance required after stent graft repair. Explained the magnitude of surgery for open and stent graft repair as well. He wishes to proceed with surgery and wishes to proceed with endovascular stent graft. I explained that we will review his films with the device manufacturer and do device planning regarding sizing for his  repair. We'll also obtain cardiac clearance prior to proceeding with surgery.     Gretta Began [facility] [place] Beeper: [phone]

## 2015-12-13 ENCOUNTER — Ambulatory Visit: Payer: BLUE CROSS/BLUE SHIELD | Admitting: Internal Medicine

## 2015-12-13 ENCOUNTER — Encounter: Payer: Self-pay | Admitting: Internal Medicine

## 2015-12-13 ENCOUNTER — Ambulatory Visit (INDEPENDENT_AMBULATORY_CARE_PROVIDER_SITE_OTHER): Payer: BLUE CROSS/BLUE SHIELD | Admitting: Internal Medicine

## 2015-12-13 VITALS — BP 113/71 | HR 64 | Ht 71.0 in | Wt 160.5 lb

## 2015-12-13 DIAGNOSIS — I10 Essential (primary) hypertension: Secondary | ICD-10-CM | POA: Diagnosis not present

## 2015-12-13 DIAGNOSIS — F17200 Nicotine dependence, unspecified, uncomplicated: Secondary | ICD-10-CM

## 2015-12-13 DIAGNOSIS — Z8249 Family history of ischemic heart disease and other diseases of the circulatory system: Secondary | ICD-10-CM

## 2015-12-13 DIAGNOSIS — Z01810 Encounter for preprocedural cardiovascular examination: Secondary | ICD-10-CM | POA: Diagnosis not present

## 2015-12-13 DIAGNOSIS — Z720 Tobacco use: Secondary | ICD-10-CM

## 2015-12-13 DIAGNOSIS — E785 Hyperlipidemia, unspecified: Secondary | ICD-10-CM

## 2015-12-13 DIAGNOSIS — I714 Abdominal aortic aneurysm, without rupture, unspecified: Secondary | ICD-10-CM

## 2015-12-13 NOTE — Patient Instructions (Signed)
Your physician has requested that you have an exercise stress myoview - needs to be before 4/5 if possible - surgery is 4/7. For further information please visit [email or web address]. Please follow instruction sheet, as given.  Your physician recommends that you schedule a follow-up appointment after your stress test/surgery (later in April)

## 2015-12-15 DIAGNOSIS — Z01810 Encounter for preprocedural cardiovascular examination: Secondary | ICD-10-CM | POA: Insufficient documentation

## 2015-12-15 DIAGNOSIS — I10 Essential (primary) hypertension: Secondary | ICD-10-CM | POA: Insufficient documentation

## 2015-12-15 DIAGNOSIS — I714 Abdominal aortic aneurysm, without rupture, unspecified: Secondary | ICD-10-CM | POA: Insufficient documentation

## 2015-12-15 DIAGNOSIS — E785 Hyperlipidemia, unspecified: Secondary | ICD-10-CM | POA: Insufficient documentation

## 2015-12-15 DIAGNOSIS — Z720 Tobacco use: Secondary | ICD-10-CM | POA: Insufficient documentation

## 2015-12-15 NOTE — Progress Notes (Signed)
OFFICE NOTE  Chief Complaint:  Preoperative risk assessment for AAA repair  Primary Care Physician: Renae FickleGAGE, JOHN, MD  HPI:  Jerry Gill is a 55 y.o. male with a past medical history of heavy smoking with over 90 pack years at 2 packs per day. He also has hypertension, dyslipidemia, anemia, and a family history of coronary disease with a father who had 5 vessel bypass and congestive heart failure. Jerry Gill underwent a lumbar x-rays when he presented with some abdominal pain in emergency department was noted to have an enlarged aorta. He underwent a CT scan that demonstrated a 5.3 cm fusiform infrarenal abdominal aortic aneurysm. He noted also that his father had aneurysm. Jerry Gill was sent by Dr. early for preoperative cardiovascular risk assessment. Jerry Gill denies any chest pain, but does get short of breath with exertion.  PMHx:  Past Medical History  Diagnosis Date  . Collagen vascular disease (HCC)   . Hypertension     No past surgical history on file.  FAMHx:  Family History  Problem Relation Age of Onset  . Heart disease Mother     before age 57  . Heart disease Father     before age 57  . AAA (abdominal aortic aneurysm) Father     SOCHx:   reports that he has been smoking Cigarettes.  He has a 94 pack-year smoking history. He does not have any smokeless tobacco history on file. He reports that he does not drink alcohol or use illicit drugs.  ALLERGIES:  Allergies  Allergen Reactions  . Codeine Nausea And Vomiting  . Sulfa Antibiotics Itching  . Penicillins Itching and Rash    Has patient had a PCN reaction causing immediate rash, facial/tongue/throat swelling, SOB or lightheadedness with hypotension: Unknown, childhood reaction Has patient had a PCN reaction causing severe rash involving mucus membranes or skin necrosis: No Has patient had a PCN reaction that required hospitalization No Has patient had a PCN reaction occurring within the last 10 years:  No If all of the above answers are "NO", then may proceed with Cephalosporin use.     ROS: Pertinent items noted in HPI and remainder of comprehensive ROS otherwise negative.  HOME MEDS: Current Outpatient Prescriptions  Medication Sig Dispense Refill  . albuterol (PROVENTIL HFA;VENTOLIN HFA) 108 (90 Base) MCG/ACT inhaler Inhale 2 puffs into the lungs every 4 (four) hours as needed for wheezing or shortness of breath. 1 Inhaler 0  . aspirin EC 81 MG tablet Take 81 mg by mouth every evening.    . fenofibrate micronized (LOFIBRA) 200 MG capsule Take 200 mg by mouth every evening.    . ferrous sulfate 325 (65 FE) MG tablet Take 325 mg by mouth daily with breakfast.    . fluticasone (FLONASE) 50 MCG/ACT nasal spray Place 1 spray into both nostrils daily as needed for allergies or rhinitis.    . metoprolol succinate (TOPROL-XL) 100 MG 24 hr tablet Take 150 mg by mouth daily. Take with or immediately following a meal.    . Multiple Vitamins-Minerals (MULTIVITAMIN GUMMIES MENS PO) Take 1 tablet by mouth 2 (two) times daily.    . Omega-3 Fatty Acids (FISH OIL TRIPLE STRENGTH) 1400 MG CAPS Take 1 tablet by mouth daily.    . traMADol (ULTRAM) 50 MG tablet Take 50 mg by mouth 2 (two) times daily.     No current facility-administered medications for this visit.    LABS/IMAGING: No results found for this or any previous visit (  from the past 48 hour(s)). No results found.  WEIGHTS: Wt Readings from Last 3 Encounters:  12/13/15 160 lb 8 oz (72.802 kg)  12/03/15 165 lb (74.844 kg)  11/03/15 167 lb (75.751 kg)    VITALS: BP 113/71 mmHg  Pulse 64  Ht  (1.803 m)  Wt 160 lb 8 oz (72.802 kg)  BMI 22.40 kg/m2  EXAM: General appearance: alert, no distress and Thin Neck: no carotid bruit and no JVD Lungs: diminished breath sounds bilaterally Heart: regular rate and rhythm, S1, S2 normal, no murmur, click, rub or gallop Abdomen: soft, non-tender; bowel sounds normal; no masses,  no  organomegaly Extremities: extremities normal, atraumatic, no cyanosis or edema Pulses: 2+ and symmetric Skin: Skin color, texture, turgor normal. No rashes or lesions Neurologic: Grossly normal Psych: Pleasant  EKG: ER EKG reviewed personally which shows normal sinus rhythm at 79 (11/28/15)  ASSESSMENT: 1. Indeterminate risk for a high risk surgery 2. Abdominal aortic aneurysm 3. Long-term heavy tobacco use 4. Dyslipidemia 5. Hypertension 6. Family history of coronary artery disease  PLAN: 1.   Jerry Gill has numerous cardiac risk risk factors and is planning to undergo an intermediate to high risk vascular surgery. He does report some shortness of breath with exertion but denies chest pain. Given his numerous cardiovascular risk factors, I recommend a stress test prior to being able to assess his risk for surgery. We should be able to accommodate that within the next week although I am told that he is ready had surgery scheduled on April 7. Hopefully his workup will be negative otherwise the surgery may need to be delayed.  Thanks for the kind referral.  Chrystie Nose, MD, [facility] Attending Cardiologist [facility]  Chrystie Nose 12/15/2015, 5:06 PM

## 2015-12-16 ENCOUNTER — Encounter (HOSPITAL_COMMUNITY)
Admission: RE | Admit: 2015-12-16 | Discharge: 2015-12-16 | Disposition: A | Discharge status: Home / Self Care | Payer: BLUE CROSS/BLUE SHIELD | Source: Ambulatory Visit | Attending: Vascular Surgery | Admitting: Vascular Surgery

## 2015-12-16 ENCOUNTER — Encounter (HOSPITAL_COMMUNITY): Payer: Self-pay

## 2015-12-16 ENCOUNTER — Ambulatory Visit: Payer: BLUE CROSS/BLUE SHIELD | Admitting: Internal Medicine

## 2015-12-16 DIAGNOSIS — I714 Abdominal aortic aneurysm, without rupture: Secondary | ICD-10-CM | POA: Diagnosis not present

## 2015-12-16 DIAGNOSIS — Z01812 Encounter for preprocedural laboratory examination: Secondary | ICD-10-CM | POA: Diagnosis not present

## 2015-12-16 DIAGNOSIS — Z0183 Encounter for blood typing: Secondary | ICD-10-CM | POA: Insufficient documentation

## 2015-12-16 HISTORY — DX: Other intervertebral disc degeneration, lumbar region without mention of lumbar back pain or lower extremity pain: M51.369

## 2015-12-16 HISTORY — DX: Abdominal aortic aneurysm, without rupture, unspecified: I71.40

## 2015-12-16 HISTORY — DX: Abdominal aortic aneurysm, without rupture: I71.4

## 2015-12-16 HISTORY — DX: Other intervertebral disc degeneration, lumbar region: M51.36

## 2015-12-16 LAB — SURGICAL PCR SCREEN
MRSA, PCR: NEGATIVE
Staphylococcus aureus: NEGATIVE

## 2015-12-16 LAB — URINALYSIS, ROUTINE W REFLEX MICROSCOPIC
BILIRUBIN URINE: NEGATIVE
GLUCOSE, UA: NEGATIVE mg/dL
HGB URINE DIPSTICK: NEGATIVE
Ketones, ur: NEGATIVE mg/dL
Leukocytes, UA: NEGATIVE
Nitrite: NEGATIVE
PH: 6.5 (ref 5.0–8.0)
Protein, ur: NEGATIVE mg/dL
SPECIFIC GRAVITY, URINE: 1.01 (ref 1.005–1.030)

## 2015-12-16 LAB — CBC
HEMATOCRIT: 42.3 % (ref 39.0–52.0)
Hemoglobin: 14.3 g/dL (ref 13.0–17.0)
MCH: 31.2 pg (ref 26.0–34.0)
MCHC: 33.8 g/dL (ref 30.0–36.0)
MCV: 92.2 fL (ref 78.0–100.0)
Platelets: 267 10*3/uL (ref 150–400)
RBC: 4.59 MIL/uL (ref 4.22–5.81)
RDW: 12.9 % (ref 11.5–15.5)
WBC: 8.1 10*3/uL (ref 4.0–10.5)

## 2015-12-16 LAB — COMPREHENSIVE METABOLIC PANEL
ALBUMIN: 4 g/dL (ref 3.5–5.0)
ALT: 14 U/L — ABNORMAL LOW (ref 17–63)
ANION GAP: 13 (ref 5–15)
AST: 14 U/L — ABNORMAL LOW (ref 15–41)
Alkaline Phosphatase: 27 U/L — ABNORMAL LOW (ref 38–126)
BILIRUBIN TOTAL: 0.8 mg/dL (ref 0.3–1.2)
BUN: 15 mg/dL (ref 6–20)
CALCIUM: 9.5 mg/dL (ref 8.9–10.3)
CO2: 20 mmol/L — ABNORMAL LOW (ref 22–32)
Chloride: 108 mmol/L (ref 101–111)
Creatinine, Ser: 1.05 mg/dL (ref 0.61–1.24)
GFR calc non Af Amer: >60 mL/min (ref >60)
GLUCOSE: 111 mg/dL — AB (ref 65–99)
POTASSIUM: 4.2 mmol/L (ref 3.5–5.1)
SODIUM: 141 mmol/L (ref 135–145)
TOTAL PROTEIN: 6.9 g/dL (ref 6.5–8.1)

## 2015-12-16 LAB — PROTIME-INR
INR: 1.04 (ref 0.00–1.49)
Prothrombin Time: 13.8 seconds (ref 11.6–15.2)

## 2015-12-16 LAB — BLOOD GAS, ARTERIAL
ACID-BASE DEFICIT: 1.1 mmol/L (ref 0.0–2.0)
Bicarbonate: 22.7 mEq/L (ref 20.0–24.0)
Drawn by: 449841
FIO2: 0.21
O2 SAT: 92.9 %
PATIENT TEMPERATURE: 98.6
PO2 ART: 64.5 mmHg — AB (ref 80.0–100.0)
TCO2: 23.8 mmol/L (ref 0–100)
pCO2 arterial: 35.3 mmHg (ref 35.0–45.0)
pH, Arterial: 7.425 (ref 7.350–7.450)

## 2015-12-16 LAB — TYPE AND SCREEN
ABO/RH(D): A POS
ANTIBODY SCREEN: NEGATIVE

## 2015-12-16 LAB — APTT: aPTT: 33 seconds (ref 24–37)

## 2015-12-16 NOTE — Pre-Procedure Instructions (Signed)
    Scharlene Cornhomas Goya  12/16/2015      [facility] [place] - [address] [employer] [place] [id number] Phone: [phone] Fax: [phone]    Your procedure is scheduled on    12/20/15   Friday    [facility] at 530 A.M.  Call this number if you have problems the morning of surgery:  [phone]   Remember:  Do not eat food or drink liquids after midnight.  Take these medicines the morning of surgery with A SIP OF WATER    Albuterol, flonase, metoprolol (toprol), tramadol   (STOP ASPIRIN AS DIRECTED, MULTIVITAMIN, FISH OIL, GOODYS/ BC'S, IBUPROFEN/ ADVIL/ MOTRIN , HERBAL MEDICINES)   Do not wear jewelry, make-up or nail polish.  Do not wear lotions, powders, or perfumes.  You may wear deodorant.  Do not shave 48 hours prior to surgery.  Men may shave face and neck.  Do not bring valuables to the hospital.  [facility] is not responsible for any belongings or valuables.  Contacts, dentures or bridgework may not be worn into surgery.  Leave your suitcase in the car.  After surgery it may be brought to your room.  For patients admitted to the hospital, discharge time will be determined by your treatment team.  Patients discharged the day of surgery will not be allowed to drive home.   Name and phone number of your driver:    Special instructions:  SEE PREPARING FOR SURGERY   Please read over the following fact sheets that you were given. Pain Booklet, Coughing and Deep Breathing, Blood Transfusion Information, MRSA Information and Surgical Site Infection Prevention

## 2015-12-16 NOTE — Progress Notes (Signed)
   12/16/15 0927  OBSTRUCTIVE SLEEP APNEA  Have you ever been diagnosed with sleep apnea through a sleep study? No  Do you snore loudly (loud enough to be heard through closed doors)?  1  Do you often feel tired, fatigued, or sleepy during the daytime (such as falling asleep during driving or talking to someone)? 0  Has anyone observed you stop breathing during your sleep? 1  Do you have, or are you being treated for high blood pressure? 1  BMI more than 35 kg/m2? 0  Age > 50 (1-yes) 1  Neck circumference greater than:Male 16 inches or larger, Male 17inches or larger? 0  Male Gender (Yes=1) 1  Obstructive Sleep Apnea Score 5  Score 5 or greater  Results sent to PCP

## 2015-12-17 ENCOUNTER — Encounter (HOSPITAL_COMMUNITY)
Admission: RE | Admit: 2015-12-17 | Discharge: 2015-12-17 | Disposition: A | Discharge status: Home / Self Care | Payer: BLUE CROSS/BLUE SHIELD | Source: Ambulatory Visit | Attending: Internal Medicine | Admitting: Internal Medicine

## 2015-12-17 ENCOUNTER — Encounter: Payer: Self-pay | Admitting: *Deleted

## 2015-12-17 DIAGNOSIS — Z720 Tobacco use: Secondary | ICD-10-CM | POA: Diagnosis present

## 2015-12-17 DIAGNOSIS — I10 Essential (primary) hypertension: Secondary | ICD-10-CM | POA: Insufficient documentation

## 2015-12-17 DIAGNOSIS — Z8249 Family history of ischemic heart disease and other diseases of the circulatory system: Secondary | ICD-10-CM

## 2015-12-17 DIAGNOSIS — Z01810 Encounter for preprocedural cardiovascular examination: Secondary | ICD-10-CM

## 2015-12-17 DIAGNOSIS — E785 Hyperlipidemia, unspecified: Secondary | ICD-10-CM | POA: Insufficient documentation

## 2015-12-17 DIAGNOSIS — F17200 Nicotine dependence, unspecified, uncomplicated: Secondary | ICD-10-CM

## 2015-12-17 LAB — NM MYOCAR MULTI W/SPECT W/WALL MOTION / EF
CSEPED: 4 min
CSEPEDS: 5 s
CSEPEW: 1 METS
CSEPPHR: 99 {beats}/min
MPHR: 164 {beats}/min
Percent HR: 60 %
Rest HR: 78 {beats}/min

## 2015-12-17 LAB — ABO/RH: ABO/RH(D): A POS

## 2015-12-17 MED ORDER — TECHNETIUM TC 99M SESTAMIBI GENERIC - CARDIOLITE
30.0000 | Freq: Once | INTRAVENOUS | Status: AC | PRN
  Administered 2015-12-17: 30 via INTRAVENOUS

## 2015-12-17 MED ORDER — TECHNETIUM TC 99M SESTAMIBI GENERIC - CARDIOLITE
10.0000 | Freq: Once | INTRAVENOUS | Status: AC | PRN
  Administered 2015-12-17: 10 via INTRAVENOUS

## 2015-12-17 MED ORDER — REGADENOSON 0.4 MG/5ML IV SOLN
0.4000 mg | Freq: Once | INTRAVENOUS | Status: AC
  Administered 2015-12-17: 0.4 mg via INTRAVENOUS

## 2015-12-17 MED ORDER — REGADENOSON 0.4 MG/5ML IV SOLN
INTRAVENOUS | Status: AC
  Filled 2015-12-17: qty 5

## 2015-12-17 NOTE — Progress Notes (Addendum)
Anesthesia Chart Review: Patient is a 55 year old male scheduled for endovascular stent graft repair of AAA on 12/20/15 by Dr. Arbie CookeyEarly.  History includes smoking, AAA, HTN, UHR, DDD, collagen vascular disease (not specified), family history of CAD and AAA. PCP is Dr. Renae FickleJohn Gage.   He was referred to cardiologist Dr. Zoila ShutterKenneth Hilty for a pre-operative evaluation, seen on 12/15/15. A nuclear stress test was recommended which is being done today.   Meds include albuterol, ASA, Lofibra, Flonase, Toprol XL, fish oil, tramadol.  11/28/15 EKG: NSR.  11/29/15 CTA abd/pelvis: IMPRESSION: 1. No acute intra-abdominal finding. 2. 53 mm fusiform infrarenal aortic aneurysm. Recommend followup by abdomen and pelvis CTA in 3-6 months, and vascular surgery referral/consultation if not already obtained.  11/28/15 CXR: IMPRESSION: No active cardiopulmonary disease.  Preoperative labs noted. Cr 1.05. Glucose 111. CBC, UA, PT/PTT WNL. ABG showed a pO2 of 64.5, pCO2 35.3, pH 7.425.  Chart will be left for follow-up stress test results.  Jerry Gill Jerry Hush, PA-C [facility]Anesthesiology Phone [phone] 12/17/2015 12:20 PM  Addendum:   12/17/15 Nuclear stress test: IMPRESSION: 1. Possible infarction involving the inferior and septal segments. No reversible ischemia is present. 2. Hypokinesia within the inferior and septal segments. 3. Left ventricular ejection fraction 62% 4. Low-risk stress test findings*.  Dr. Rennis GoldenHilty reviewed and wrote, "Stress test was low risk - but possible areas of inferoseptal scar or possibly artfiact. Normal LV function. Ok for surgery."  Jerry Gill Taleigh Gero, PA-C [facility] Phone [phone] 12/19/2015 2:53 PM

## 2015-12-17 NOTE — Progress Notes (Addendum)
GXT changed to Lexi since pt took home rx of metoprolol XL 150 mg today.  1 day study, [place] to read. Dr Rennis GoldenHilty aware  Raford PitcherBarrett, Deneen HartsRhonda, PA-C 12/17/2015 10:05 AM Beeper [phone]

## 2015-12-18 ENCOUNTER — Encounter: Payer: Self-pay | Admitting: Vascular Surgery

## 2015-12-18 ENCOUNTER — Telehealth: Payer: Self-pay | Admitting: Internal Medicine

## 2015-12-18 NOTE — Telephone Encounter (Signed)
Request for surgical clearance:  1. What type of surgery is being performed? Endovascular Stent   2. When is this surgery scheduled? 12-20-15-Friday   3. Are there any medications that need to be held prior to surgery and how long?Cardiac Clearance-Pt had stress test yesterday-need the results to proceed    4. Name of physician performing surgery? Dr Tawanna Coolerodd Early   5. What is your office phone and fax number? [phone] and Fax #-[phone] GNF:[id number]:Stephanie  6.

## 2015-12-18 NOTE — Telephone Encounter (Signed)
Returned call to [place] with Dr.Early's office.Advised myoview results are pending,will send message to Dr.Hilty and his nurse Eileen StanfordJenna.

## 2015-12-19 ENCOUNTER — Telehealth: Payer: Self-pay | Admitting: Internal Medicine

## 2015-12-19 ENCOUNTER — Encounter: Payer: Self-pay | Admitting: Internal Medicine

## 2015-12-19 MED ORDER — SODIUM CHLORIDE 0.9 % IV SOLN: INTRAVENOUS | Status: DC

## 2015-12-19 MED ORDER — VANCOMYCIN HCL IN DEXTROSE 1-5 GM/200ML-% IV SOLN
1000.0000 mg | INTRAVENOUS | Status: AC
  Administered 2015-12-20: 1000 mg via INTRAVENOUS
  Filled 2015-12-19: qty 200

## 2015-12-19 NOTE — Telephone Encounter (Signed)
Clearance letter sent for surgery.  Dr HRexene Edison

## 2015-12-19 NOTE — Telephone Encounter (Signed)
F/u  Pt following up on surgical clearance- Please call back and discuss.

## 2015-12-19 NOTE — Telephone Encounter (Signed)
New message    Pt wife is calling to speak to rn about test results 12/17/15

## 2015-12-19 NOTE — Telephone Encounter (Signed)
Called pt back and told him clearance has been sent to Dr Bosie HelperEarly's office. Advised pt to call Dr Bosie HelperEarly's office to confirm details of his procedure with them.

## 2015-12-20 ENCOUNTER — Encounter (HOSPITAL_COMMUNITY)
Admission: RE | Disposition: A | Discharge status: Home / Self Care | Payer: Self-pay | Source: Ambulatory Visit | Attending: Vascular Surgery

## 2015-12-20 ENCOUNTER — Encounter (HOSPITAL_COMMUNITY): Payer: Self-pay | Admitting: Surgery

## 2015-12-20 ENCOUNTER — Inpatient Hospital Stay (HOSPITAL_COMMUNITY)
Admission: RE | Admit: 2015-12-20 | Discharge: 2015-12-21 | DRG: 269 | Disposition: A | Discharge status: Home / Self Care | Payer: BLUE CROSS/BLUE SHIELD | Source: Ambulatory Visit | Attending: Vascular Surgery | Admitting: Vascular Surgery

## 2015-12-20 ENCOUNTER — Other Ambulatory Visit: Payer: Self-pay | Admitting: *Deleted

## 2015-12-20 ENCOUNTER — Inpatient Hospital Stay (HOSPITAL_COMMUNITY): Payer: BLUE CROSS/BLUE SHIELD

## 2015-12-20 ENCOUNTER — Inpatient Hospital Stay (HOSPITAL_COMMUNITY): Payer: BLUE CROSS/BLUE SHIELD | Admitting: Anesthesiology

## 2015-12-20 ENCOUNTER — Inpatient Hospital Stay (HOSPITAL_COMMUNITY): Payer: BLUE CROSS/BLUE SHIELD | Admitting: Vascular Surgery

## 2015-12-20 DIAGNOSIS — F17210 Nicotine dependence, cigarettes, uncomplicated: Secondary | ICD-10-CM | POA: Diagnosis present

## 2015-12-20 DIAGNOSIS — Z9889 Other specified postprocedural states: Secondary | ICD-10-CM

## 2015-12-20 DIAGNOSIS — J449 Chronic obstructive pulmonary disease, unspecified: Secondary | ICD-10-CM | POA: Diagnosis present

## 2015-12-20 DIAGNOSIS — I714 Abdominal aortic aneurysm, without rupture, unspecified: Secondary | ICD-10-CM | POA: Diagnosis present

## 2015-12-20 DIAGNOSIS — I10 Essential (primary) hypertension: Secondary | ICD-10-CM | POA: Diagnosis present

## 2015-12-20 DIAGNOSIS — Z79899 Other long term (current) drug therapy: Secondary | ICD-10-CM | POA: Diagnosis not present

## 2015-12-20 DIAGNOSIS — Z7982 Long term (current) use of aspirin: Secondary | ICD-10-CM

## 2015-12-20 DIAGNOSIS — I739 Peripheral vascular disease, unspecified: Secondary | ICD-10-CM | POA: Diagnosis present

## 2015-12-20 DIAGNOSIS — Z48812 Encounter for surgical aftercare following surgery on the circulatory system: Secondary | ICD-10-CM

## 2015-12-20 HISTORY — PX: ABDOMINAL AORTIC ENDOVASCULAR STENT GRAFT: SHX5707

## 2015-12-20 LAB — BASIC METABOLIC PANEL
Anion gap: 8 (ref 5–15)
BUN: 10 mg/dL (ref 6–20)
CALCIUM: 8.8 mg/dL — AB (ref 8.9–10.3)
CHLORIDE: 109 mmol/L (ref 101–111)
CO2: 22 mmol/L (ref 22–32)
CREATININE: 0.97 mg/dL (ref 0.61–1.24)
GFR calc Af Amer: >60 mL/min (ref >60)
GFR calc non Af Amer: >60 mL/min (ref >60)
Glucose, Bld: 117 mg/dL — ABNORMAL HIGH (ref 65–99)
Potassium: 3.9 mmol/L (ref 3.5–5.1)
SODIUM: 139 mmol/L (ref 135–145)

## 2015-12-20 LAB — CBC
HEMATOCRIT: 36.6 % — AB (ref 39.0–52.0)
HEMOGLOBIN: 11.8 g/dL — AB (ref 13.0–17.0)
MCH: 29.4 pg (ref 26.0–34.0)
MCHC: 32.2 g/dL (ref 30.0–36.0)
MCV: 91.3 fL (ref 78.0–100.0)
Platelets: 258 10*3/uL (ref 150–400)
RBC: 4.01 MIL/uL — ABNORMAL LOW (ref 4.22–5.81)
RDW: 12.7 % (ref 11.5–15.5)
WBC: 6.9 10*3/uL (ref 4.0–10.5)

## 2015-12-20 LAB — APTT: aPTT: 34 seconds (ref 24–37)

## 2015-12-20 LAB — MAGNESIUM: Magnesium: 1.6 mg/dL — ABNORMAL LOW (ref 1.7–2.4)

## 2015-12-20 LAB — PROTIME-INR
INR: 1.13 (ref 0.00–1.49)
Prothrombin Time: 14.7 seconds (ref 11.6–15.2)

## 2015-12-20 SURGERY — INSERTION, ENDOVASCULAR STENT GRAFT, AORTA, ABDOMINAL
Anesthesia: General

## 2015-12-20 MED ORDER — SENNOSIDES-DOCUSATE SODIUM 8.6-50 MG PO TABS: 1.0000 | ORAL_TABLET | Freq: Every evening | ORAL | Status: DC | PRN

## 2015-12-20 MED ORDER — TRAMADOL HCL 50 MG PO TABS
50.0000 mg | ORAL_TABLET | Freq: Four times a day (QID) | ORAL | Status: DC | PRN
  Administered 2015-12-20 (×2): 50 mg via ORAL
  Filled 2015-12-20 (×2): qty 1

## 2015-12-20 MED ORDER — FENTANYL CITRATE (PF) 100 MCG/2ML IJ SOLN
INTRAMUSCULAR | Status: DC | PRN
  Administered 2015-12-20 (×2): 50 ug via INTRAVENOUS
  Administered 2015-12-20: 100 ug via INTRAVENOUS
  Administered 2015-12-20: 50 ug via INTRAVENOUS

## 2015-12-20 MED ORDER — MEPERIDINE HCL 25 MG/ML IJ SOLN: 6.2500 mg | INTRAMUSCULAR | Status: DC | PRN

## 2015-12-20 MED ORDER — CHLORHEXIDINE GLUCONATE 4 % EX LIQD: 60.0000 mL | Freq: Once | CUTANEOUS | Status: DC

## 2015-12-20 MED ORDER — PANTOPRAZOLE SODIUM 40 MG PO TBEC
40.0000 mg | DELAYED_RELEASE_TABLET | Freq: Every day | ORAL | Status: DC
  Administered 2015-12-20 – 2015-12-21 (×2): 40 mg via ORAL
  Filled 2015-12-20 (×2): qty 1

## 2015-12-20 MED ORDER — FLUTICASONE PROPIONATE 50 MCG/ACT NA SUSP: 1.0000 | Freq: Every day | NASAL | Status: DC | PRN

## 2015-12-20 MED ORDER — MIDAZOLAM HCL 2 MG/2ML IJ SOLN
INTRAMUSCULAR | Status: AC
  Filled 2015-12-20: qty 2

## 2015-12-20 MED ORDER — OXYCODONE HCL 5 MG PO TABS: 5.0000 mg | ORAL_TABLET | Freq: Once | ORAL | Status: DC | PRN

## 2015-12-20 MED ORDER — HYDROMORPHONE HCL 1 MG/ML IJ SOLN
INTRAMUSCULAR | Status: AC
  Filled 2015-12-20: qty 1

## 2015-12-20 MED ORDER — PROTAMINE SULFATE 10 MG/ML IV SOLN
INTRAVENOUS | Status: AC
  Filled 2015-12-20: qty 5

## 2015-12-20 MED ORDER — FERROUS SULFATE 325 (65 FE) MG PO TABS
325.0000 mg | ORAL_TABLET | Freq: Every day | ORAL | Status: DC
  Administered 2015-12-21: 325 mg via ORAL
  Filled 2015-12-20: qty 1

## 2015-12-20 MED ORDER — LIDOCAINE HCL (CARDIAC) 20 MG/ML IV SOLN
INTRAVENOUS | Status: AC
  Filled 2015-12-20: qty 5

## 2015-12-20 MED ORDER — ENOXAPARIN SODIUM 40 MG/0.4ML ~~LOC~~ SOLN: 40.0000 mg | SUBCUTANEOUS | Status: DC

## 2015-12-20 MED ORDER — ONDANSETRON HCL 4 MG/2ML IJ SOLN
INTRAMUSCULAR | Status: AC
  Filled 2015-12-20: qty 2

## 2015-12-20 MED ORDER — TRAMADOL HCL 50 MG PO TABS: 50.0000 mg | ORAL_TABLET | Freq: Four times a day (QID) | ORAL | Status: DC | PRN

## 2015-12-20 MED ORDER — PHENYLEPHRINE 40 MCG/ML (10ML) SYRINGE FOR IV PUSH (FOR BLOOD PRESSURE SUPPORT)
PREFILLED_SYRINGE | INTRAVENOUS | Status: AC
  Filled 2015-12-20: qty 10

## 2015-12-20 MED ORDER — ALUM & MAG HYDROXIDE-SIMETH 200-200-20 MG/5ML PO SUSP: 15.0000 mL | ORAL | Status: DC | PRN

## 2015-12-20 MED ORDER — OXYCODONE HCL 5 MG/5ML PO SOLN: 5.0000 mg | Freq: Once | ORAL | Status: DC | PRN

## 2015-12-20 MED ORDER — LACTATED RINGERS IV SOLN
INTRAVENOUS | Status: DC | PRN
  Administered 2015-12-20: 07:00:00 via INTRAVENOUS

## 2015-12-20 MED ORDER — IODIXANOL 320 MG/ML IV SOLN
INTRAVENOUS | Status: DC | PRN
  Administered 2015-12-20: 91 mL via INTRAVENOUS

## 2015-12-20 MED ORDER — BISACODYL 10 MG RE SUPP: 10.0000 mg | Freq: Every day | RECTAL | Status: DC | PRN

## 2015-12-20 MED ORDER — PHENOL 1.4 % MT LIQD: 1.0000 | OROMUCOSAL | Status: DC | PRN

## 2015-12-20 MED ORDER — ACETAMINOPHEN 325 MG PO TABS
325.0000 mg | ORAL_TABLET | ORAL | Status: DC | PRN
  Administered 2015-12-20 (×2): 650 mg via ORAL
  Filled 2015-12-20 (×2): qty 2

## 2015-12-20 MED ORDER — SODIUM CHLORIDE 0.9 % IV SOLN
INTRAVENOUS | Status: DC | PRN
  Administered 2015-12-20: 500 mL

## 2015-12-20 MED ORDER — DOCUSATE SODIUM 100 MG PO CAPS
100.0000 mg | ORAL_CAPSULE | Freq: Every day | ORAL | Status: DC
  Administered 2015-12-21: 100 mg via ORAL
  Filled 2015-12-20: qty 1

## 2015-12-20 MED ORDER — ONDANSETRON HCL 4 MG/2ML IJ SOLN
INTRAMUSCULAR | Status: DC | PRN
  Administered 2015-12-20: 4 mg via INTRAVENOUS

## 2015-12-20 MED ORDER — SUCCINYLCHOLINE CHLORIDE 20 MG/ML IJ SOLN
INTRAMUSCULAR | Status: DC | PRN
  Administered 2015-12-20: 140 mg via INTRAVENOUS

## 2015-12-20 MED ORDER — EPHEDRINE SULFATE 50 MG/ML IJ SOLN
INTRAMUSCULAR | Status: AC
  Filled 2015-12-20: qty 1

## 2015-12-20 MED ORDER — FENTANYL CITRATE (PF) 250 MCG/5ML IJ SOLN
INTRAMUSCULAR | Status: AC
  Filled 2015-12-20: qty 5

## 2015-12-20 MED ORDER — ROCURONIUM BROMIDE 100 MG/10ML IV SOLN
INTRAVENOUS | Status: DC | PRN
  Administered 2015-12-20: 30 mg via INTRAVENOUS
  Administered 2015-12-20: 50 mg via INTRAVENOUS

## 2015-12-20 MED ORDER — LIDOCAINE HCL (CARDIAC) 20 MG/ML IV SOLN
INTRAVENOUS | Status: DC | PRN
  Administered 2015-12-20: 100 mg via INTRAVENOUS

## 2015-12-20 MED ORDER — 0.9 % SODIUM CHLORIDE (POUR BTL) OPTIME
TOPICAL | Status: DC | PRN
  Administered 2015-12-20: 1000 mL

## 2015-12-20 MED ORDER — PHENYLEPHRINE HCL 10 MG/ML IJ SOLN
10.0000 mg | INTRAVENOUS | Status: DC | PRN
  Administered 2015-12-20: 25 ug/min via INTRAVENOUS

## 2015-12-20 MED ORDER — HEPARIN SODIUM (PORCINE) 1000 UNIT/ML IJ SOLN
INTRAMUSCULAR | Status: AC
  Filled 2015-12-20: qty 1

## 2015-12-20 MED ORDER — PROPOFOL 10 MG/ML IV BOLUS
INTRAVENOUS | Status: AC
  Filled 2015-12-20: qty 20

## 2015-12-20 MED ORDER — MIDAZOLAM HCL 5 MG/5ML IJ SOLN
INTRAMUSCULAR | Status: DC | PRN
  Administered 2015-12-20 (×2): 1 mg via INTRAVENOUS

## 2015-12-20 MED ORDER — SUGAMMADEX SODIUM 200 MG/2ML IV SOLN
INTRAVENOUS | Status: DC | PRN
  Administered 2015-12-20: 200 mg via INTRAVENOUS

## 2015-12-20 MED ORDER — TRAMADOL HCL 50 MG PO TABS: 50.0000 mg | ORAL_TABLET | Freq: Two times a day (BID) | ORAL | Status: DC

## 2015-12-20 MED ORDER — HYDRALAZINE HCL 20 MG/ML IJ SOLN: 5.0000 mg | INTRAMUSCULAR | Status: DC | PRN

## 2015-12-20 MED ORDER — HYDROMORPHONE HCL 1 MG/ML IJ SOLN
0.2500 mg | INTRAMUSCULAR | Status: DC | PRN
  Administered 2015-12-20 (×2): 0.5 mg via INTRAVENOUS

## 2015-12-20 MED ORDER — POTASSIUM CHLORIDE CRYS ER 20 MEQ PO TBCR
20.0000 meq | EXTENDED_RELEASE_TABLET | Freq: Every day | ORAL | Status: DC | PRN
Start: 2015-12-20 — End: 2015-12-21

## 2015-12-20 MED ORDER — STERILE WATER FOR INJECTION IJ SOLN
INTRAMUSCULAR | Status: AC
  Filled 2015-12-20: qty 10

## 2015-12-20 MED ORDER — FENOFIBRATE 160 MG PO TABS
160.0000 mg | ORAL_TABLET | Freq: Every day | ORAL | Status: DC
  Administered 2015-12-20 – 2015-12-21 (×2): 160 mg via ORAL
  Filled 2015-12-20 (×2): qty 1

## 2015-12-20 MED ORDER — HEPARIN SODIUM (PORCINE) 1000 UNIT/ML IJ SOLN
INTRAMUSCULAR | Status: DC | PRN
  Administered 2015-12-20: 6000 [IU] via INTRAVENOUS

## 2015-12-20 MED ORDER — METOPROLOL SUCCINATE ER 50 MG PO TB24
150.0000 mg | ORAL_TABLET | Freq: Every day | ORAL | Status: DC
  Administered 2015-12-21: 150 mg via ORAL
  Filled 2015-12-20: qty 1

## 2015-12-20 MED ORDER — MAGNESIUM SULFATE 2 GM/50ML IV SOLN: 2.0000 g | Freq: Every day | INTRAVENOUS | Status: DC | PRN

## 2015-12-20 MED ORDER — ONDANSETRON HCL 4 MG/2ML IJ SOLN: 4.0000 mg | Freq: Four times a day (QID) | INTRAMUSCULAR | Status: DC | PRN

## 2015-12-20 MED ORDER — METOPROLOL SUCCINATE ER 100 MG PO TB24
100.0000 mg | ORAL_TABLET | Freq: Once | ORAL | Status: AC
  Administered 2015-12-20: 100 mg via ORAL
  Filled 2015-12-20: qty 1

## 2015-12-20 MED ORDER — SODIUM CHLORIDE 0.9 % IV SOLN
INTRAVENOUS | Status: DC
  Administered 2015-12-20: 16:00:00 via INTRAVENOUS

## 2015-12-20 MED ORDER — HYDROMORPHONE HCL 1 MG/ML IJ SOLN
INTRAMUSCULAR | Status: AC
  Administered 2015-12-20: 0.5 mg via INTRAVENOUS
  Filled 2015-12-20: qty 1

## 2015-12-20 MED ORDER — PROPOFOL 10 MG/ML IV BOLUS
INTRAVENOUS | Status: DC | PRN
  Administered 2015-12-20 (×2): 10 mg via INTRAVENOUS
  Administered 2015-12-20: 20 mg via INTRAVENOUS
  Administered 2015-12-20: 180 mg via INTRAVENOUS

## 2015-12-20 MED ORDER — GUAIFENESIN-DM 100-10 MG/5ML PO SYRP: 15.0000 mL | ORAL_SOLUTION | ORAL | Status: DC | PRN

## 2015-12-20 MED ORDER — LACTATED RINGERS IV SOLN
INTRAVENOUS | Status: DC | PRN
Start: 2015-12-20 — End: 2015-12-20
  Administered 2015-12-20: 07:00:00 via INTRAVENOUS

## 2015-12-20 MED ORDER — VANCOMYCIN HCL IN DEXTROSE 1-5 GM/200ML-% IV SOLN
1000.0000 mg | Freq: Two times a day (BID) | INTRAVENOUS | Status: AC
  Administered 2015-12-20 – 2015-12-21 (×2): 1000 mg via INTRAVENOUS
  Filled 2015-12-20 (×2): qty 200

## 2015-12-20 MED ORDER — PROTAMINE SULFATE 10 MG/ML IV SOLN
INTRAVENOUS | Status: DC | PRN
  Administered 2015-12-20: 50 mg via INTRAVENOUS

## 2015-12-20 MED ORDER — MORPHINE SULFATE (PF) 2 MG/ML IV SOLN: 2.0000 mg | INTRAVENOUS | Status: DC | PRN

## 2015-12-20 MED ORDER — ROCURONIUM BROMIDE 50 MG/5ML IV SOLN
INTRAVENOUS | Status: AC
  Filled 2015-12-20: qty 2

## 2015-12-20 MED ORDER — METOPROLOL TARTRATE 1 MG/ML IV SOLN: 2.0000 mg | INTRAVENOUS | Status: DC | PRN

## 2015-12-20 MED ORDER — SUGAMMADEX SODIUM 200 MG/2ML IV SOLN
INTRAVENOUS | Status: AC
  Filled 2015-12-20: qty 2

## 2015-12-20 MED ORDER — METOPROLOL SUCCINATE ER 50 MG PO TB24
50.0000 mg | ORAL_TABLET | Freq: Once | ORAL | Status: AC
  Administered 2015-12-20: 50 mg via ORAL
  Filled 2015-12-20: qty 1

## 2015-12-20 MED ORDER — ASPIRIN EC 81 MG PO TBEC
81.0000 mg | DELAYED_RELEASE_TABLET | Freq: Every evening | ORAL | Status: DC
  Administered 2015-12-20: 81 mg via ORAL
  Filled 2015-12-20: qty 1

## 2015-12-20 MED ORDER — ALBUTEROL SULFATE (2.5 MG/3ML) 0.083% IN NEBU: 2.5000 mg | INHALATION_SOLUTION | RESPIRATORY_TRACT | Status: DC | PRN

## 2015-12-20 MED ORDER — LABETALOL HCL 5 MG/ML IV SOLN: 10.0000 mg | INTRAVENOUS | Status: DC | PRN

## 2015-12-20 MED ORDER — ACETAMINOPHEN 325 MG RE SUPP
325.0000 mg | RECTAL | Status: DC | PRN
  Filled 2015-12-20: qty 2

## 2015-12-20 MED ORDER — SODIUM CHLORIDE 0.9 % IV SOLN: 500.0000 mL | Freq: Once | INTRAVENOUS | Status: DC | PRN

## 2015-12-20 SURGICAL SUPPLY — 72 items
APL SKNCLS STERI-STRIP NONHPOA (GAUZE/BANDAGES/DRESSINGS) ×2
BENZOIN TINCTURE PRP APPL 2/3 (GAUZE/BANDAGES/DRESSINGS) ×6 IMPLANT
CANISTER SUCTION 2500CC (MISCELLANEOUS) ×3 IMPLANT
CATH ANGIO 5F BER2 65CM (CATHETERS) ×3 IMPLANT
CATH BEACON 5.038 65CM KMP-01 (CATHETERS) ×3 IMPLANT
CATH OMNI FLUSH .035X70CM (CATHETERS) ×3 IMPLANT
CLIP LIGATING EXTRA MED SLVR (CLIP) IMPLANT
CLIP LIGATING EXTRA SM BLUE (MISCELLANEOUS) IMPLANT
CLOSURE STERI-STRIP 1/2X4 (GAUZE/BANDAGES/DRESSINGS) ×2
CLOSURE WOUND 1/2 X4 (GAUZE/BANDAGES/DRESSINGS) ×1
CLSR STERI-STRIP ANTIMIC 1/2X4 (GAUZE/BANDAGES/DRESSINGS) ×4 IMPLANT
COVER DOME SNAP 22 D (MISCELLANEOUS) ×3 IMPLANT
COVER PROBE W GEL 5X96 (DRAPES) ×3 IMPLANT
DEVICE CLOSURE PERCLS PRGLD 6F (VASCULAR PRODUCTS) ×5 IMPLANT
DRSG TEGADERM 2-3/8X2-3/4 SM (GAUZE/BANDAGES/DRESSINGS) ×6 IMPLANT
DRYSEAL FLEXSHEATH 12FR 33CM (SHEATH) ×2
DRYSEAL FLEXSHEATH 18FR 33CM (SHEATH) ×2
ELECT CAUTERY BLADE 6.4 (BLADE) ×3 IMPLANT
ELECT REM PT RETURN 9FT ADLT (ELECTROSURGICAL) ×6
ELECTRODE REM PT RTRN 9FT ADLT (ELECTROSURGICAL) ×2 IMPLANT
EXCLUDER TNK LEG 28MX12X14 (Endovascular Graft) ×1 IMPLANT
EXCLUDER TRUNK LEG 28MX12X14 (Endovascular Graft) ×3 IMPLANT
GAUZE SPONGE 2X2 8PLY STRL LF (GAUZE/BANDAGES/DRESSINGS) ×2 IMPLANT
GLOVE BIOGEL PI IND STRL 7.0 (GLOVE) ×2 IMPLANT
GLOVE BIOGEL PI INDICATOR 7.0 (GLOVE) ×4
GLOVE ECLIPSE 7.5 STRL STRAW (GLOVE) ×6 IMPLANT
GLOVE SS BIOGEL STRL SZ 7.5 (GLOVE) ×1 IMPLANT
GLOVE SUPERSENSE BIOGEL SZ 7.5 (GLOVE) ×2
GOWN STRL REUS W/ TWL LRG LVL3 (GOWN DISPOSABLE) ×3 IMPLANT
GOWN STRL REUS W/TWL LRG LVL3 (GOWN DISPOSABLE) ×9
GRAFT BALLN CATH 65CM (STENTS) ×2 IMPLANT
GRAFT EXCLUDER AORTIC 28X3.3CM (Endovascular Graft) ×3 IMPLANT
GRAFT EXCLUDER LEG 12X10 (Endovascular Graft) ×3 IMPLANT
GUIDEWIRE WHOLEY .035 145 JTIP (WIRE) ×3 IMPLANT
KIT BASIN OR (CUSTOM PROCEDURE TRAY) ×3 IMPLANT
KIT ROOM TURNOVER OR (KITS) ×3 IMPLANT
LEG CONTRALATERAL 18X11.5 (Endovascular Graft) ×3 IMPLANT
LOOP VESSEL MAXI BLUE (MISCELLANEOUS) ×3 IMPLANT
LOOP VESSEL MINI RED (MISCELLANEOUS) ×3 IMPLANT
NEEDLE PERC 18GX7CM (NEEDLE) ×3 IMPLANT
NS IRRIG 1000ML POUR BTL (IV SOLUTION) ×3 IMPLANT
PACK ENDOVASCULAR (PACKS) ×3 IMPLANT
PAD ARMBOARD 7.5X6 YLW CONV (MISCELLANEOUS) ×6 IMPLANT
PENCIL BUTTON HOLSTER BLD 10FT (ELECTRODE) ×3 IMPLANT
PERCLOSE PROGLIDE 6F (VASCULAR PRODUCTS) ×15
SHEATH AVANTI 11CM 8FR (MISCELLANEOUS) ×3 IMPLANT
SHEATH BRITE TIP 8FR 23CM (MISCELLANEOUS) ×3 IMPLANT
SHEATH DRYSEAL FLEX 12FR 33CM (SHEATH) ×1 IMPLANT
SHEATH DRYSEAL FLEX 18FR 33CM (SHEATH) ×1 IMPLANT
SPONGE GAUZE 2X2 STER 10/PKG (GAUZE/BANDAGES/DRESSINGS) ×4
STAPLER VISISTAT 35W (STAPLE) IMPLANT
STENT GRAFT BALLN CATH 65CM (STENTS) ×4
STOPCOCK MORSE 400PSI 3WAY (MISCELLANEOUS) ×3 IMPLANT
STRIP CLOSURE SKIN 1/2X4 (GAUZE/BANDAGES/DRESSINGS) ×2 IMPLANT
SUT ETHILON 3 0 PS 1 (SUTURE) IMPLANT
SUT PROLENE 5 0 C 1 24 (SUTURE) IMPLANT
SUT PROLENE 6 0 CC (SUTURE) ×3 IMPLANT
SUT SILK 2 0 (SUTURE) ×3
SUT SILK 2-0 18XBRD TIE 12 (SUTURE) ×1 IMPLANT
SUT SILK 3 0 (SUTURE) ×3
SUT SILK 3-0 18XBRD TIE 12 (SUTURE) ×1 IMPLANT
SUT VIC AB 2-0 CTX 36 (SUTURE) IMPLANT
SUT VIC AB 3-0 SH 18 (SUTURE) IMPLANT
SUT VIC AB 3-0 SH 27 (SUTURE) ×6
SUT VIC AB 3-0 SH 27X BRD (SUTURE) ×2 IMPLANT
SUT VICRYL 4-0 PS2 18IN ABS (SUTURE) ×6 IMPLANT
SYR 30ML LL (SYRINGE) ×3 IMPLANT
TAPE CLOTH SURG 4X10 WHT LF (GAUZE/BANDAGES/DRESSINGS) ×6 IMPLANT
TRAY FOLEY W/METER SILVER 16FR (SET/KITS/TRAYS/PACK) ×3 IMPLANT
TUBING HIGH PRESSURE 120CM (CONNECTOR) ×3 IMPLANT
WIRE AMPLATZ SS-J .035X180CM (WIRE) ×6 IMPLANT
WIRE BENTSON .035X145CM (WIRE) ×9 IMPLANT

## 2015-12-20 NOTE — Op Note (Signed)
OPERATIVE REPORT  DATE OF SURGERY: 12/20/2015  PATIENT: Jerry Gill, 55 y.o. male MRN: [id number]  DOB: 05/13/1961  PRE-OPERATIVE DIAGNOSIS: Abdominal aortic aneurysm  POST-OPERATIVE DIAGNOSIS:  Same  PROCEDURE: Gore stent graft repair of abdominal aortic aneurysm  SURGEON:  Gretta Beganodd Early, M.D.   ASSISTANT: Dr. Harrell GaveLawson, Collins PA-C  ANESTHESIA:  Gen.  EBL: 100 ml  Total I/O In: 1400 [I.V.:1400] Out: 350 [Urine:250; Blood:100]  BLOOD ADMINISTERED: None  DRAINS: None  SPECIMEN: None  COUNTS CORRECT:  YES  PLAN OF CARE: PACU   PATIENT DISPOSITION:  PACU - hemodynamically stable  PROCEDURE DETAILS: Patient was taken to room placed supine position where the area both groins and abdomen were prepped and draped in usual sterile fashion. Using a 2 Perclose devices on the right groin the right common femoral artery was accessed with a SonoSite visualization guidewire was passed up the level aorta and this is confirmed with fluoroscopy. Perclose devices were deployed in the 2:00 and 10:00 location and were held for later closure. An 8 French sheath was passed over the guidewire. The left common femoral artery was accessed with an 18-gauge needle and the guidewire would not pass centrally. Several attempts with good arterial backbleeding were obtained in the guidewire would not pass centrally. For this reason decision was made to do a cutdown over this. Small incision was made directly over the common femoral artery. The artery was controlled with a blue vessel loops proximally and distally. Direct puncture of the artery with an 18-gauge needle allowed passage of a Bentson wire into the aorta and a 8 French sheath was passed over this guidewire as well. The patient was given 6000 units of intravenous heparin. The Bentson wires were exchanged for Amplatz superstiff wires bilaterally. A 12 French sheath was placed on the lRight and a 13 sheath was placed on the Left. The main body was a 28  x 12 x 14 device. This was placed to the left femoral sheath was place in a reversed leg position. This is positioned at the level of the renal arteries. The left renal artery was the lowest renal artery. A pigtail catheter was placed through the femoral sheath above the level of the graft. Arteriogram was used to show the level of the renal arteries. The main body was deployed just below the left renal artery which was the lowest renal artery. The pigtail catheter was brought into the aneurysm sac and the contralateral gate was easily cannulated. Retrograde injection through the right femoral sheath revealed the level of the iliac artery takeoff. The right iliac extension was an 18 x 12 graft. This was positioned to land just above the internal iliac takeoff with the appropriate overlap in the main body. The right leg extension was deployed. Next the left limb of the main body was deployed. Retrograde injection to the femoral sheath revealed the location of the hypogastric takeoff. A 12 x 10 extension was positioned in the left iliac limb and was deployed to land just above the iliac artery takeoff. A every 50 balloon was brought onto the field and the proximal and distal attachment sites and all overlaps were gently dilated with this balloon. A pigtail catheter is positioned the back above the level of the graft. Arteriogram revealed possible small type I endoleak to the right of the main body just below the right renal artery. This area was re-ballooned dilated and repeat arteriogram continue to show probable slight type I endoleak at this level. For this  reason a 28 x 3 aortic cuff was chosen and was positioned to land just below the right renal artery. This was deployed and repeat balloon dilatated. Final completion arteriogram revealed excellent positioning with no evidence of endoleak. The femoral sheath and guide was removed and the Perclose devices were closed which gave excellent hemostasis the guidewire  was removed. On the left the sheath was removed and the common femoral artery was occluded proximal and distally. Good flushing maneuvers showed no evidence of clot there are no evidence of injury to the artery. The artery was closed with a running 6-0 Prolene suture and the clamps were removed. The patient was given 50 mg of protamine to reverse heparin. Wounds were closed with 3-0 Vicryl in the subcutaneous tissue in several layers on the left and skin was closed with a 3-0 subcuticular Vicryl suture. The puncture site on the right was closed with a 4-0 subcuticular stitch. Patient was transferred to the recovery room in stable condition with a good flow to both feet.   Gretta Began, M.D. 12/20/2015 10:31 AM

## 2015-12-20 NOTE — H&P (View-Only) (Signed)
[facility]  Patient name: Jerry Gill MRN: [id number] DOB: 11/22/1960 Sex: male  REASON FOR CONSULT: Evaluation of abdominal aortic aneurysm  HPI: Jerry Cornhomas Rosario is a 55 y.o. male, who is seen today for recent diagnosis of abdominal aortic aneurysm. He was having back and flank discomfort and underwent plain films which suggested a large infrarenal abdominal aortic aneurysm. He underwent CT scan for further evaluation of this. This showed a 5.3 cm infrarenal abdominal aortic aneurysm with no evidence of rupture. His back and flank discomfort. To be musculoskeletal. The patient had no known history of aneurysm. Does have a history of aneurysm in his father and had open repair many years ago with no untoward consequences. Also had coronary bypass grafting for his aneurysm repair. The patient does not have any prior history of cardiac disease. He does have a long history of cigarette smoking. On questioning him he does have very clear-cut Claudication bilaterally. No history of rest pain or tissue loss. He does have COPD related to his of long 2 pack per day cigarette smoking. Does occasionally use a inhaler and this had a recent 2 months of respiratory issues that he reports is a cold.  Past Medical History  Diagnosis Date  . Collagen vascular disease (HCC)   . Hypertension     Family History  Problem Relation Age of Onset  . Heart disease Mother     before age 57  . Heart disease Father     before age 57  . AAA (abdominal aortic aneurysm) Father     SOCIAL HISTORY: Social History   Social History  . Marital Status: Married    Spouse Name: N/A  . Number of Children: N/A  . Years of Education: N/A   Occupational History  . Not on file.   Social History Main Topics  . Smoking status: Current Some Day Smoker -- 2.00 packs/day for 47 years    Types: Cigarettes  . Smokeless tobacco: Not on file  . Alcohol Use: No  . Drug Use: No  . Sexual Activity:  Not on file   Other Topics Concern  . Not on file   Social History Narrative    Allergies  Allergen Reactions  . Codeine Nausea And Vomiting  . Sulfa Antibiotics Itching  . Penicillins Itching and Rash    Current Outpatient Prescriptions  Medication Sig Dispense Refill  . albuterol (PROVENTIL HFA;VENTOLIN HFA) 108 (90 Base) MCG/ACT inhaler Inhale 2 puffs into the lungs every 4 (four) hours as needed for wheezing or shortness of breath. 1 Inhaler 0  . aspirin EC 81 MG tablet Take 81 mg by mouth every evening.    . benzonatate (TESSALON) 100 MG capsule Take 1 capsule (100 mg total) by mouth 3 (three) times daily as needed for cough. 30 capsule 0  . fenofibrate micronized (LOFIBRA) 200 MG capsule Take 200 mg by mouth every evening.    . ferrous sulfate 325 (65 FE) MG tablet Take 325 mg by mouth daily with breakfast.    . ibuprofen (ADVIL,MOTRIN) 800 MG tablet Take 1 tablet (800 mg total) by mouth every 8 (eight) hours as needed for mild pain. 30 tablet 0  . metoprolol succinate (TOPROL-XL) 100 MG 24 hr tablet Take 150 mg by mouth daily. Take with or immediately following a meal.    . Omega-3 Fatty Acids (FISH OIL TRIPLE STRENGTH) 1400 MG CAPS Take 1 tablet by mouth daily.     No current facility-administered medications  for this visit.    REVIEW OF SYSTEMS:  [X]  denotes positive finding, [ ]  denotes negative finding Cardiac  Comments:  Chest pain or chest pressure:    Shortness of breath upon exertion:    Short of breath when lying flat:    Irregular heart rhythm:        Vascular    Pain in calf, thigh, or hip brought on by ambulation:    Pain in feet at night that wakes you up from your sleep:     Blood clot in your veins:    Leg swelling:         Pulmonary    Oxygen at home:    Productive cough:     Wheezing:         Neurologic    Sudden weakness in arms or legs:     Sudden numbness in arms or legs:     Sudden onset of difficulty speaking or slurred speech:      Temporary loss of vision in one eye:     Problems with dizziness:         Gastrointestinal    Blood in stool:     Vomited blood:         Genitourinary    Burning when urinating:     Blood in urine:        Psychiatric    Major depression:         Hematologic    Bleeding problems:    Problems with blood clotting too easily:        Skin    Rashes or ulcers:        Constitutional    Fever or chills:      PHYSICAL EXAM: Filed Vitals:   12/03/15 0831  BP: 134/90  Pulse: 66  Height: 5\' 11"  (1.803 m)  Weight: 165 lb (74.844 kg)  SpO2: 100%    GENERAL: The patient is a well-nourished male, in no acute distress. The vital signs are documented above. CARDIAC: There is a regular rate and rhythm.  VASCULAR: 2+ radial and femoral pulses bilaterally. I do not palpate pedal pulses PULMONARY: There is good air exchange bilaterally without wheezing or rales. ABDOMEN: Soft and non-tender with normal pitched bowel sounds. Easily palpable aneurysm which is nontender MUSCULOSKELETAL: There are no major deformities or cyanosis. NEUROLOGIC: No focal weakness or paresthesias are detected. SKIN: There are no ulcers or rashes noted. PSYCHIATRIC: The patient has a normal affect.  DATA:  I reviewed his CT scan from 11/28/2015 and discussed at length with the patient. He does have an infrarenal abdominal aortic aneurysm. He does have an adequate infrarenal neck for stent graft repair. His iliac arteries are ectatic bilateral but become normal caliber above the iliac bifurcations. He does have some plaque in his iliac arteries bilaterally but does not appear to have any difficulty regarding access for stent graft grafting.   Impression and plan I had an extensive discussion with the patient and his wife regarding recommendation for elective repair. Also explain the option of open surgical repair versus stent graft repair. He does appear to have favorable anatomy for stent graft. I did explain  lifelong surveillance required after stent graft repair. Explained the magnitude of surgery for open and stent graft repair as well. He wishes to proceed with surgery and wishes to proceed with endovascular stent graft. I explained that we will review his films with the device manufacturer and do device planning regarding sizing for his  repair. We'll also obtain cardiac clearance prior to proceeding with surgery.     Gretta Began [facility] [place] Beeper: [phone]

## 2015-12-20 NOTE — Progress Notes (Signed)
Utilization review completed.  

## 2015-12-20 NOTE — Anesthesia Procedure Notes (Signed)
Procedure Name: Intubation Performed by: Everlene BallsHAYES, Modean Mccullum T Pre-anesthesia Checklist: Patient identified, Emergency Drugs available, Suction available, Patient being monitored and Timeout performed Patient Re-evaluated:Patient Re-evaluated prior to inductionOxygen Delivery Method: Circle system utilized Preoxygenation: Pre-oxygenation with 100% oxygen Intubation Type: IV induction Ventilation: Mask ventilation without difficulty and Oral airway inserted - appropriate to patient size Laryngoscope Size: Mac and 3 Grade View: Grade II Tube type: Oral Number of attempts: 1 Airway Equipment and Method: Stylet Placement Confirmation: ETT inserted through vocal cords under direct vision,  positive ETCO2,  CO2 detector and breath sounds checked- equal and bilateral Secured at: 23 cm Tube secured with: Tape Dental Injury: Teeth and Oropharynx as per pre-operative assessment

## 2015-12-20 NOTE — Interval H&P Note (Signed)
History and Physical Interval Note:  12/20/2015 7:03 AM  Jerry Gill  has presented today for surgery, with the diagnosis of Abdominal Aortic Aneurysm  I71.4  The various methods of treatment have been discussed with the patient and family. After consideration of risks, benefits and other options for treatment, the patient has consented to  Procedure(s): ABDOMINAL AORTIC ENDOVASCULAR STENT GRAFT (N/A) as a surgical intervention .  The patient's history has been reviewed, patient examined, no change in status, stable for surgery.  I have reviewed the patient's chart and labs.  Questions were answered to the patient's satisfaction.     Gretta BeganEarly, Branden Shallenberger

## 2015-12-20 NOTE — Anesthesia Preprocedure Evaluation (Addendum)
Anesthesia Evaluation  Patient identified by MRN, date of birth, ID band Patient awake    Reviewed: Allergy & Precautions, NPO status , Patient's Chart, lab work & pertinent test results  Airway Mallampati: I  TM Distance: >3 FB Neck ROM: Full    Dental  (+) Teeth Intact, Dental Advisory Given, Missing   Pulmonary Current Smoker,    breath sounds clear to auscultation       Cardiovascular hypertension, Pt. on medications and Pt. on home beta blockers + Peripheral Vascular Disease   Rhythm:Regular Rate:Normal     Neuro/Psych    GI/Hepatic   Endo/Other    Renal/GU      Musculoskeletal   Abdominal   Peds  Hematology   Anesthesia Other Findings   Reproductive/Obstetrics                            Anesthesia Physical Anesthesia Plan  ASA: III  Anesthesia Plan: General   Post-op Pain Management:    Induction: Intravenous  Airway Management Planned: Oral ETT  Additional Equipment:   Intra-op Plan:   Post-operative Plan: Extubation in OR  Informed Consent: I have reviewed the patients History and Physical, chart, labs and discussed the procedure including the risks, benefits and alternatives for the proposed anesthesia with the patient or authorized representative who has indicated his/her understanding and acceptance.   Dental advisory given  Plan Discussed with: CRNA, Anesthesiologist and Surgeon  Anesthesia Plan Comments:         Anesthesia Quick Evaluation

## 2015-12-20 NOTE — Anesthesia Postprocedure Evaluation (Signed)
Anesthesia Post Note  Patient: Jerry Gill  Procedure(s) Performed: Procedure(s) (LRB): ABDOMINAL AORTIC ENDOVASCULAR STENT GRAFT (N/A)  Patient location during evaluation: PACU Anesthesia Type: General Level of consciousness: awake and alert Pain management: pain level controlled Vital Signs Assessment: post-procedure vital signs reviewed and stable Respiratory status: spontaneous breathing, nonlabored ventilation and respiratory function stable Cardiovascular status: blood pressure returned to baseline and stable Postop Assessment: no signs of nausea or vomiting Anesthetic complications: no    Last Vitals:  Filed Vitals:   12/20/15 1100 12/20/15 1101  BP:  111/78  Pulse: 64 64  Temp:    Resp: 11 11    Last Pain:  Filed Vitals:   12/20/15 1107  PainSc: 2                  Aalaiyah Yassin A

## 2015-12-20 NOTE — Transfer of Care (Signed)
Immediate Anesthesia Transfer of Care Note  Patient: Jerry Gill  Procedure(s) Performed: Procedure(s): ABDOMINAL AORTIC ENDOVASCULAR STENT GRAFT (N/A)  Patient Location: PACU  Anesthesia Type:General  Level of Consciousness: awake, patient cooperative and responds to stimulation  Airway & Oxygen Therapy: Patient Spontanous Breathing and Patient connected to nasal cannula oxygen  Post-op Assessment: Report given to RN and Post -op Vital signs reviewed and stable  Post vital signs: Reviewed and stable  Last Vitals:  Filed Vitals:   12/20/15 0557  BP: 171/81  Pulse: 73  Temp: 36.7 C  Resp: 18    Complications: No apparent anesthesia complications

## 2015-12-20 NOTE — Progress Notes (Signed)
  Day of Surgery Note    Subjective:  No complaints  Filed Vitals:   12/20/15 1430 12/20/15 1500  BP: 133/87 131/78  Pulse: 69 70  Temp:  98.1 F (36.7 C)  Resp: 16 15    Incisions:   Bilateral groin bandages are clean and groins are soft Extremities:  2+ palpable bilateral PT/DP pulses Cardiac:  regular Lungs:  Non labored Abdomen:  Soft, NT/ND  Assessment/Plan:  This is a 55 y.o. male who is s/p Gore stent graft repair of abdominal aortic aneurysm   -pt doing well this afternoon with easily palpable pedal pulses -abdomen is soft -most likely discharge tomorrow   Doreatha MassedSamantha Ryin Ambrosius, PA-C 12/20/2015 3:20 PM

## 2015-12-20 NOTE — Plan of Care (Signed)
Problem: Phase I Progression Outcomes Goal: Vascular site scale level 0 - I Vascular Site Scale Level 0: No bruising/bleeding/hematoma Level I (Mild): Bruising/Ecchymosis, minimal bleeding/ooozing, palpable hematoma < 3 cm Level II (Moderate): Bleeding not affecting hemodynamic parameters, pseudoaneurysm, palpable hematoma > 3 cm Level III (Severe) Bleeding which affects hemodynamic parameters or retroperitoneal hemorrhage  Outcome: Progressing Level 0        

## 2015-12-21 LAB — CBC
HEMATOCRIT: 36.4 % — AB (ref 39.0–52.0)
Hemoglobin: 12.1 g/dL — ABNORMAL LOW (ref 13.0–17.0)
MCH: 30.3 pg (ref 26.0–34.0)
MCHC: 33.2 g/dL (ref 30.0–36.0)
MCV: 91.2 fL (ref 78.0–100.0)
Platelets: 266 10*3/uL (ref 150–400)
RBC: 3.99 MIL/uL — ABNORMAL LOW (ref 4.22–5.81)
RDW: 12.8 % (ref 11.5–15.5)
WBC: 8.7 10*3/uL (ref 4.0–10.5)

## 2015-12-21 LAB — BASIC METABOLIC PANEL
ANION GAP: 9 (ref 5–15)
BUN: 9 mg/dL (ref 6–20)
CALCIUM: 8.6 mg/dL — AB (ref 8.9–10.3)
CO2: 22 mmol/L (ref 22–32)
Chloride: 107 mmol/L (ref 101–111)
Creatinine, Ser: 0.86 mg/dL (ref 0.61–1.24)
GFR calc Af Amer: >60 mL/min (ref >60)
GFR calc non Af Amer: >60 mL/min (ref >60)
GLUCOSE: 105 mg/dL — AB (ref 65–99)
POTASSIUM: 3.8 mmol/L (ref 3.5–5.1)
Sodium: 138 mmol/L (ref 135–145)

## 2015-12-21 NOTE — Progress Notes (Signed)
Pt ambulated approximately 300 ft around unit with no assistive device.  Pt remained free of pain and VS remained WNL.  Pt now sitting on the side of the bed. Will continue to monitor patient.

## 2015-12-21 NOTE — Discharge Summary (Signed)
[facility] Discharge Summary   Jerry Gill Dec 03, 1960 55 y.o. male  MRN: [id number]  Admission Date: 12/20/2015  Discharge Date: 12/21/15  Physician: Larina Earthly, MD  Admission Diagnosis: Abdominal Aortic Aneurysm  I71.4   HPI:   This is a 55 y.o. male who is seen today for recent diagnosis of abdominal aortic aneurysm. He was having back and flank discomfort and underwent plain films which suggested a large infrarenal abdominal aortic aneurysm. He underwent CT scan for further evaluation of this. This showed a 5.3 cm infrarenal abdominal aortic aneurysm with no evidence of rupture. His back and flank discomfort. To be musculoskeletal. The patient had no known history of aneurysm. Does have a history of aneurysm in his father and had open repair many years ago with no untoward consequences. Also had coronary bypass grafting for his aneurysm repair. The patient does not have any prior history of cardiac disease. He does have a long history of cigarette smoking. On questioning him he does have very clear-cut Claudication bilaterally. No history of rest pain or tissue loss. He does have COPD related to his of long 2 pack per day cigarette smoking. Does occasionally use a inhaler and this had a recent 2 months of respiratory issues that he reports is a cold.  Hospital Course:  The patient was admitted to the hospital and taken to the operating room on 12/20/2015 and underwent: Gore stent graft repair of abdominal aortic aneurysm    The pt tolerated the procedure well and was transported to the PACU in good condition.   By POD 1, he was doing well.  His abdomen was soft, groins were soft without hematoma and easily palpable PT pulses bilaterally.   The remainder of the hospital course consisted of increasing mobilization and increasing intake of solids without difficulty.  CBC    Component Value Date/Time   WBC 8.7 12/21/2015 0330   RBC 3.99* 12/21/2015 0330   HGB 12.1* 12/21/2015 0330   HCT 36.4* 12/21/2015 0330   PLT 266 12/21/2015 0330   MCV 91.2 12/21/2015 0330   MCH 30.3 12/21/2015 0330   MCHC 33.2 12/21/2015 0330   RDW 12.8 12/21/2015 0330    BMET    Component Value Date/Time   NA 138 12/21/2015 0330   K 3.8 12/21/2015 0330   CL 107 12/21/2015 0330   CO2 22 12/21/2015 0330   GLUCOSE 105* 12/21/2015 0330   BUN 9 12/21/2015 0330   CREATININE 0.86 12/21/2015 0330   CALCIUM 8.6* 12/21/2015 0330   GFRNONAA >60 12/21/2015 0330   GFRAA >60 12/21/2015 0330       Discharge Instructions    ABDOMINAL PROCEDURE/ANEURYSM REPAIR/AORTO-BIFEMORAL BYPASS:  Call MD for increased abdominal pain; cramping diarrhea; nausea/vomiting    Complete by:  As directed      Call MD for:  redness, tenderness, or signs of infection (pain, swelling, bleeding, redness, odor or green/yellow discharge around incision site)    Complete by:  As directed      Call MD for:  severe or increased pain, loss or decreased feeling  in affected limb(s)    Complete by:  As directed      Call MD for:  temperature >100.5    Complete by:  As directed      Discharge instructions    Complete by:  As directed   You may shower in 24 hours, remove dressings shower then place band aides over groin incisions as nneded     Driving Restrictions  Complete by:  As directed   No driving for 2 weeks     Lifting restrictions    Complete by:  As directed   No heavy lifting for 6 weeks     Resume previous diet    Complete by:  As directed            Discharge Diagnosis:  Abdominal Aortic Aneurysm  I71.4  Secondary Diagnosis: Patient Active Problem List   Diagnosis Date Noted  . AAA (abdominal aortic aneurysm) (HCC) 12/15/2015  . Dyslipidemia 12/15/2015  . Essential hypertension 12/15/2015  . Preoperative cardiovascular examination 12/15/2015  . Tobacco abuse 12/15/2015   Past Medical History  Diagnosis Date  . Collagen vascular disease (HCC)   . Hypertension   . AAA (abdominal aortic  aneurysm) (HCC)   . DDD (degenerative disc disease), lumbar        Medication List    TAKE these medications        albuterol 108 (90 Base) MCG/ACT inhaler  Commonly known as:  PROVENTIL HFA;VENTOLIN HFA  Inhale 2 puffs into the lungs every 4 (four) hours as needed for wheezing or shortness of breath.     aspirin EC 81 MG tablet  Take 81 mg by mouth every evening.     fenofibrate micronized 200 MG capsule  Commonly known as:  LOFIBRA  Take 200 mg by mouth every evening.     ferrous sulfate 325 (65 FE) MG tablet  Take 325 mg by mouth daily with breakfast.     FISH OIL TRIPLE STRENGTH 1400 MG Caps  Take 1 tablet by mouth daily.     fluticasone 50 MCG/ACT nasal spray  Commonly known as:  FLONASE  Place 1 spray into both nostrils daily as needed for allergies or rhinitis.     metoprolol succinate 100 MG 24 hr tablet  Commonly known as:  TOPROL-XL  Take 150 mg by mouth daily. Take with or immediately following a meal.     MULTIVITAMIN GUMMIES MENS PO  Take 1 tablet by mouth 2 (two) times daily.     traMADol 50 MG tablet  Commonly known as:  ULTRAM  Take 1 tablet (50 mg total) by mouth every 6 (six) hours as needed.        Prescriptions given: Tramadol #30 No Refill  Instructions: 1.  Shower daily with soap and water starting 12/22/15  Disposition: home  Patient's condition: is Good  Follow up: 1. Dr. Arbie CookeyEarly in 4 weeks with CTA   Doreatha MassedSamantha Lalena Salas, PA-C [facility] [phone] 12/21/2015  7:45 AM   - For VQI Registry use --- Instructions: Press F2 to tab through selections.  Delete question if not applicable.   Post-op:  Time to Extubation: [x]  In OR, [ ]  < 12 hrs, [ ]  12-24 hrs, [ ]  >=24 hrs Vasopressors Req. Post-op: No MI: No., [ ]  Troponin only, [ ]  EKG or Clinical New Arrhythmia: No CHF: No ICU Stay: 1 day in stepdown Transfusion: No  If yes, n/a units given  Complications: Resp failure: No., [ ]  Pneumonia, [ ]  Ventilator Chg  in renal function: No., [ ]  Inc. Cr > 0.5, [ ]  Temp. Dialysis, [ ]  Permanent dialysis Leg ischemia: No., no Surgery needed, [ ]  Yes, Surgery needed, [ ]  Amputation Bowel ischemia: No., [ ]  Medical Rx, [ ]  Surgical Rx Wound complication: No., [ ]  Superficial separation/infection, [ ]  Return to OR Return to OR: No  Return to OR for bleeding: No Stroke: No., [ ]   Minor,  Major  Discharge medications: Statin use:  No If No:  For Medical reasons,  Non-compliant,  Not-indicated ASA use:  Yes  If No:  For Medical reasons,  Non-compliant,  Not-indicated Plavix use:  No If No:  For Medical reasons,  Non-compliant,  Not-indicated Beta blocker use:  Yes If No:  For Medical reasons,  Non-compliant,  Not-indicated

## 2015-12-21 NOTE — Progress Notes (Signed)
Discharge instructions given. No questions or concerns at this time. IV d/c'd. Tip intact. Pt tolerated well. Pt d/c'd via family vehicle.  

## 2015-12-21 NOTE — Progress Notes (Addendum)
  Progress Note    12/21/2015 7:43 AM 1 Day Post-Op  Subjective:  Ready to go home  Afebrile HR 60's-70's NSR 110's-140's systolic 97% RA   Filed Vitals:   12/21/15 0200 12/21/15 0400  BP: 113/78 127/85  Pulse: 59 69  Temp:  98.4 F (36.9 C)  Resp: 10 12    Physical Exam: Cardiac:  regular Lungs:  Non labored Incisions:  Bilateral groins are soft without hematoma Extremities:  Easily palpable PT pusles bilaterall Abdomen:  Soft, NT/ND  CBC    Component Value Date/Time   WBC 8.7 12/21/2015 0330   RBC 3.99* 12/21/2015 0330   HGB 12.1* 12/21/2015 0330   HCT 36.4* 12/21/2015 0330   PLT 266 12/21/2015 0330   MCV 91.2 12/21/2015 0330   MCH 30.3 12/21/2015 0330   MCHC 33.2 12/21/2015 0330   RDW 12.8 12/21/2015 0330    BMET    Component Value Date/Time   NA 138 12/21/2015 0330   K 3.8 12/21/2015 0330   CL 107 12/21/2015 0330   CO2 22 12/21/2015 0330   GLUCOSE 105* 12/21/2015 0330   BUN 9 12/21/2015 0330   CREATININE 0.86 12/21/2015 0330   CALCIUM 8.6* 12/21/2015 0330   GFRNONAA >60 12/21/2015 0330   GFRAA >60 12/21/2015 0330    INR    Component Value Date/Time   INR 1.13 12/20/2015 1030     Intake/Output Summary (Last 24 hours) at 12/21/15 0743 Last data filed at 12/21/15 0700  Gross per 24 hour  Intake   6105 ml  Output   5925 ml  Net    180 ml     Assessment:  55 y.o. male is s/p:  Gore stent graft repair of abdominal aortic aneurysm   1 Day Post-Op  Plan: -pt doing well this morning with easily palpable PT pulses -abdomen soft -discharge home and f/u in 4 weeks with CTA   Doreatha MassedSamantha Rhyne, PA-C [facility] [phone] 12/21/2015 7:43 AM  I have interviewed the patient and examined the patient. I agree with the findings by the PA.  Cari Carawayhris Dickson, MD [phone]

## 2015-12-24 ENCOUNTER — Encounter (HOSPITAL_COMMUNITY): Payer: Self-pay | Admitting: Vascular Surgery

## 2015-12-27 ENCOUNTER — Telehealth: Payer: Self-pay | Admitting: Vascular Surgery

## 2015-12-27 NOTE — Telephone Encounter (Signed)
sched appts for 5/9, cta at [facility] [phone] at 1 and md at 2:15. Spoke to pt to inform them of appt.

## 2015-12-27 NOTE — Telephone Encounter (Signed)
-----   Message from Sharee PimpleMarilyn K McChesney, RN sent at 12/20/2015  9:58 AM EDT ----- Regarding: schedule   ----- Message -----    From: Lars MageEmma M Collins, PA-C    Sent: 12/20/2015   9:36 AM      To: [facility] Charge Pool  F/U with Dr. Arbie CookeyEarly in 4 weeks s/p [facility] CTA abd/pelvis

## 2016-01-02 ENCOUNTER — Ambulatory Visit: Payer: BLUE CROSS/BLUE SHIELD | Admitting: Internal Medicine

## 2016-01-16 ENCOUNTER — Encounter: Payer: Self-pay | Admitting: Vascular Surgery

## 2016-01-21 ENCOUNTER — Ambulatory Visit
Admission: RE | Admit: 2016-01-21 | Discharge: 2016-01-21 | Disposition: A | Discharge status: Home / Self Care | Payer: BLUE CROSS/BLUE SHIELD | Source: Ambulatory Visit | Attending: Vascular Surgery | Admitting: Vascular Surgery

## 2016-01-21 ENCOUNTER — Encounter: Payer: Self-pay | Admitting: Vascular Surgery

## 2016-01-21 ENCOUNTER — Ambulatory Visit (INDEPENDENT_AMBULATORY_CARE_PROVIDER_SITE_OTHER): Payer: Self-pay | Admitting: Vascular Surgery

## 2016-01-21 VITALS — BP 134/88 | HR 63 | Temp 97.5°F | Resp 18 | Ht 70.5 in | Wt 158.4 lb

## 2016-01-21 DIAGNOSIS — Z48812 Encounter for surgical aftercare following surgery on the circulatory system: Secondary | ICD-10-CM

## 2016-01-21 DIAGNOSIS — I714 Abdominal aortic aneurysm, without rupture, unspecified: Secondary | ICD-10-CM

## 2016-01-21 MED ORDER — IOPAMIDOL (ISOVUE-370) INJECTION 76%
75.0000 mL | Freq: Once | INTRAVENOUS | Status: AC | PRN
  Administered 2016-01-21: 75 mL via INTRAVENOUS

## 2016-01-21 NOTE — Progress Notes (Signed)
Filed Vitals:   01/21/16 1404 01/21/16 1407  BP: 142/90 134/88  Pulse: 63   Temp: 97.5 F (36.4 C)   TempSrc: Oral   Resp: 18   Height: 5' 10.5" (1.791 m)   Weight: 158 lb 6.4 oz (71.85 kg)   SpO2: 100%

## 2016-01-21 NOTE — Progress Notes (Signed)
   Patient name: Jerry Gill MRN: [id number] DOB: 09/30/1960 Sex: male  REASON FOR VISIT: All of stent graft repair of abdominal aortic aneurysm on 12/20/2015  HPI: Jerry Cornhomas Sellick is a 55 y.o. male here today for follow-up of stent graft repair is done well since discharge to home on postoperative day #1. He does report some tenderness in his left groin otherwise is returned to his usual baseline.  Current Outpatient Prescriptions  Medication Sig Dispense Refill  . albuterol (PROVENTIL HFA;VENTOLIN HFA) 108 (90 Base) MCG/ACT inhaler Inhale 2 puffs into the lungs every 4 (four) hours as needed for wheezing or shortness of breath. 1 Inhaler 0  . aspirin EC 81 MG tablet Take 81 mg by mouth every evening.    . fenofibrate micronized (LOFIBRA) 200 MG capsule Take 200 mg by mouth every evening.    . ferrous sulfate 325 (65 FE) MG tablet Take 325 mg by mouth daily with breakfast.    . fluticasone (FLONASE) 50 MCG/ACT nasal spray Place 1 spray into both nostrils daily as needed for allergies or rhinitis.    . metoprolol succinate (TOPROL-XL) 100 MG 24 hr tablet Take 150 mg by mouth daily. Take with or immediately following a meal.    . Multiple Vitamins-Minerals (MULTIVITAMIN GUMMIES MENS PO) Take 1 tablet by mouth 2 (two) times daily.    . Omega-3 Fatty Acids (FISH OIL TRIPLE STRENGTH) 1400 MG CAPS Take 1 tablet by mouth daily.    . traMADol (ULTRAM) 50 MG tablet Take 1 tablet (50 mg total) by mouth every 6 (six) hours as needed. 30 tablet 0   No current facility-administered medications for this visit.      PHYSICAL EXAM: Filed Vitals:   01/21/16 1404 01/21/16 1407  BP: 142/90 134/88  Pulse: 63   Temp: 97.5 F (36.4 C)   TempSrc: Oral   Resp: 18   Height: 5' 10.5" (1.791 m)   Weight: 158 lb 6.4 oz (71.85 kg)   SpO2: 100%     GENERAL: The patient is a well-nourished male, in no acute distress. The vital signs are documented above. Abdomen soft and nontender. No pulsatile mass in his  abdomen Left groin with the slight stitch abscess at the lower pole of his incision. This was removed and there was slight amount of purulence present. 2+ femoral and 2+ posterior tibial pulses bilaterally  CT scan abdomen and pelvis reveals excellent positioning of his stent graft no evidence of endoleak and slight diminished size.   MEDICAL ISSUES: Table overall. We'll see him again in 6 months with ultrasound of his stent graft repair  Kail Fraley [facility] [place] Beeper: [phone]

## 2016-01-24 ENCOUNTER — Ambulatory Visit: Payer: BLUE CROSS/BLUE SHIELD | Admitting: Internal Medicine

## 2016-02-04 ENCOUNTER — Encounter: Payer: Self-pay | Admitting: *Deleted

## 2016-03-05 NOTE — Addendum Note (Signed)
Addended by: Sharee PimpleMCCHESNEY, Kylar Leonhardt K on: 03/05/2016 03:30 PM   Modules accepted: Orders

## 2016-04-05 ENCOUNTER — Emergency Department (HOSPITAL_COMMUNITY)
Admission: EM | Admit: 2016-04-05 | Discharge: 2016-04-05 | Disposition: A | Discharge status: Home / Self Care | Payer: BLUE CROSS/BLUE SHIELD | Attending: Emergency Medicine | Admitting: Emergency Medicine

## 2016-04-05 ENCOUNTER — Encounter (HOSPITAL_COMMUNITY): Payer: Self-pay | Admitting: Emergency Medicine

## 2016-04-05 DIAGNOSIS — Z7982 Long term (current) use of aspirin: Secondary | ICD-10-CM | POA: Insufficient documentation

## 2016-04-05 DIAGNOSIS — W57XXXA Bitten or stung by nonvenomous insect and other nonvenomous arthropods, initial encounter: Secondary | ICD-10-CM | POA: Diagnosis not present

## 2016-04-05 DIAGNOSIS — Y9389 Activity, other specified: Secondary | ICD-10-CM | POA: Diagnosis not present

## 2016-04-05 DIAGNOSIS — I10 Essential (primary) hypertension: Secondary | ICD-10-CM | POA: Insufficient documentation

## 2016-04-05 DIAGNOSIS — Z79899 Other long term (current) drug therapy: Secondary | ICD-10-CM | POA: Diagnosis not present

## 2016-04-05 DIAGNOSIS — Y999 Unspecified external cause status: Secondary | ICD-10-CM | POA: Diagnosis not present

## 2016-04-05 DIAGNOSIS — F17210 Nicotine dependence, cigarettes, uncomplicated: Secondary | ICD-10-CM | POA: Insufficient documentation

## 2016-04-05 DIAGNOSIS — S40861A Insect bite (nonvenomous) of right upper arm, initial encounter: Secondary | ICD-10-CM | POA: Diagnosis not present

## 2016-04-05 DIAGNOSIS — Y92007 Garden or yard of unspecified non-institutional (private) residence as the place of occurrence of the external cause: Secondary | ICD-10-CM | POA: Insufficient documentation

## 2016-04-05 MED ORDER — DOXYCYCLINE HYCLATE 100 MG PO CAPS: 100.0000 mg | ORAL_CAPSULE | Freq: Two times a day (BID) | ORAL | 0 refills | Status: DC

## 2016-04-05 NOTE — ED Provider Notes (Signed)
[facility]-EM[facility]Y DEPT Provider Note   CSN: [id number] Arrival date & time: 04/05/16  1644  First Provider Contact:  First MD Initiated Contact with Patient 04/05/16 5:31 PM By signing my name below, I, Jerry Gill, attest that this documentation has been prepared under the direction and in the presence of Physician Assistant, Burgess Amor, PA-C.  Electronically Signed: Levon Gill, Scribe. 04/05/2016. 5:36 PM.   History   Chief Complaint Chief Complaint  Patient presents with  . Insect Bite    HPI Jerry Gill is a 55 y.o. male who presents to the Emergency Department complaining of itching lesion to right forearm. He reports was working in the yard when he was bit by an insect, although did not see the culprit, but suspects this as the nidus for his current wound.  No alleviating factors noted. He notes associated erythema and pain. Pt states pain is worsened by touch. He also notes some drainage, describing a pustule which drained yesterday. Per nursing notes, pt denies fever. Pt is allergic to penicillin, sulfur, and codeine. No other complaints or questions at this time.    The history is provided by the patient. No language interpreter was used.    Past Medical History:  Diagnosis Date  . AAA (abdominal aortic aneurysm) (HCC)   . Collagen vascular disease (HCC)   . DDD (degenerative disc disease), lumbar   . Hypertension     Patient Active Problem List   Diagnosis Date Noted  . AAA (abdominal aortic aneurysm) (HCC) 12/15/2015  . Dyslipidemia 12/15/2015  . Essential hypertension 12/15/2015  . Preoperative cardiovascular examination 12/15/2015  . Tobacco abuse 12/15/2015    Past Surgical History:  Procedure Laterality Date  . ABDOMINAL AORTIC ENDOVASCULAR STENT GRAFT N/A 12/20/2015   Procedure: ABDOMINAL AORTIC ENDOVASCULAR STENT GRAFT;  Surgeon: Larina Earthly, MD;  Location: [facility] OR;  Service: Vascular;  Laterality: N/A;  . hemmrroidectomy    . UMBILICAL HERNIA REPAIR       as infant       Home Medications    Prior to Admission medications   Medication Sig Start Date End Date Taking? Authorizing Provider  albuterol (PROVENTIL HFA;VENTOLIN HFA) 108 (90 Base) MCG/ACT inhaler Inhale 2 puffs into the lungs every 4 (four) hours as needed for wheezing or shortness of breath. 11/03/15   Kristen N Ward, DO  aspirin EC 81 MG tablet Take 81 mg by mouth every evening.    Historical Provider, MD  fenofibrate micronized (LOFIBRA) 200 MG capsule Take 200 mg by mouth every evening.    Historical Provider, MD  ferrous sulfate 325 (65 FE) MG tablet Take 325 mg by mouth daily with breakfast.    Historical Provider, MD  fluticasone (FLONASE) 50 MCG/ACT nasal spray Place 1 spray into both nostrils daily as needed for allergies or rhinitis.    Historical Provider, MD  metoprolol succinate (TOPROL-XL) 100 MG 24 hr tablet Take 150 mg by mouth daily. Take with or immediately following a meal.    Historical Provider, MD  Multiple Vitamins-Minerals (MULTIVITAMIN GUMMIES MENS PO) Take 1 tablet by mouth 2 (two) times daily.    Historical Provider, MD  Omega-3 Fatty Acids (FISH OIL TRIPLE STRENGTH) 1400 MG CAPS Take 1 tablet by mouth daily.    Historical Provider, MD  traMADol (ULTRAM) 50 MG tablet Take 1 tablet (50 mg total) by mouth every 6 (six) hours as needed. 12/20/15   Lars Mage, PA-C    Family History Family History  Problem Relation Age of  Onset  . Heart disease Father     before age 57  . AAA (abdominal aortic aneurysm) Father   . Cancer Father   . Heart disease Mother     before age 84  . Hypertension Mother   . Hyperlipidemia Brother     Social History Social History  Substance Use Topics  . Smoking status: Current Some Day Smoker    Packs/day: 0.50    Years: 47.00    Types: Cigarettes  . Smokeless tobacco: Never Used  . Alcohol use No     Allergies   Codeine; Penicillins; and Sulfa antibiotics   Review of Systems Review of Systems  Constitutional:  Negative for fever.  Skin: Positive for color change and wound.  All other systems reviewed and are negative.   Physical Exam Updated Vital Signs BP 122/88   Pulse 77   Temp 99 F (37.2 C)   Resp 18   Ht 5' 10.5" (1.791 m)   Wt 153 lb (69.4 kg)   SpO2 98%   BMI 21.64 kg/m   Physical Exam  Constitutional: He is oriented to person, place, and time. He appears well-developed and well-nourished.  HENT:  Head: Normocephalic and atraumatic.  Eyes: Conjunctivae and EOM are normal. Right eye exhibits no discharge. Left eye exhibits no discharge. No scleral icterus.  Pulmonary/Chest: Effort normal.  Neurological: He is alert and oriented to person, place, and time.  Skin:  2 cm raised indurated lesion right proximal volar forearm. There is no fluctuance. There is a 1 cm halo of surrounding erythema without red streaking. There is a small central eschar.   Nursing note and vitals reviewed.    ED Treatments / Results  DIAGNOSTIC STUDIES:  Oxygen Saturation is 98% on RA, normal by my interpretation.    COORDINATION OF CARE:  5:33 PM Discussed treatment plan which includes soaking in warm water 3x daily with pt at bedside and pt agreed to plan.     Procedures Procedures (including critical care time)  Medications Ordered in ED Medications - No data to display   Initial Impression / Assessment and Plan / ED Course  I have reviewed the triage vital signs and the nursing notes.  Pertinent labs & imaging results that were available during my care of the patient were reviewed by me and considered in my medical decision making (see chart for details).  Clinical Course    Pt with infected insect bite, early surrounding localized cellulitis.  Placed on doxycycline with first dose given here. Advised warm epsom salt soaks bid. F/u with pcp for are recheck if sx are not starting to respond after 48 hours of abx.  I personally performed the services described in this  documentation, which was scribed in my presence. The recorded information has been reviewed and is accurate.   Final Clinical Impressions(s) / ED Diagnoses   Final diagnoses:  None    New Prescriptions Discharge Medication List as of 04/05/2016  5:48 PM    START taking these medications   Details  doxycycline (VIBRAMYCIN) 100 MG capsule Take 1 capsule (100 mg total) by mouth 2 (two) times daily., Starting Sun 04/05/2016, Print         Burgess Amor, PA-C 04/06/16 1027    Margarita Grizzle, MD 04/08/16 2536

## 2016-04-05 NOTE — ED Triage Notes (Signed)
Patient c/o possible bug bite to right arm. Per patient started noticing it 2 days ago. Patient has pustule to area with redness and pain. Denies any fever. Per patient some drainage.

## 2016-05-05 DIAGNOSIS — Z0279 Encounter for issue of other medical certificate: Secondary | ICD-10-CM | POA: Diagnosis not present

## 2016-07-29 ENCOUNTER — Encounter: Payer: Self-pay | Admitting: Vascular Surgery

## 2016-08-04 ENCOUNTER — Ambulatory Visit (INDEPENDENT_AMBULATORY_CARE_PROVIDER_SITE_OTHER): Payer: Medicaid Other | Admitting: Vascular Surgery

## 2016-08-04 ENCOUNTER — Ambulatory Visit (HOSPITAL_COMMUNITY)
Admission: RE | Admit: 2016-08-04 | Discharge: 2016-08-04 | Disposition: A | Discharge status: Home / Self Care | Payer: Medicaid Other | Source: Ambulatory Visit | Attending: Vascular Surgery | Admitting: Vascular Surgery

## 2016-08-04 VITALS — BP 138/90 | HR 60 | Temp 97.4°F | Resp 14 | Ht 70.5 in | Wt 156.0 lb

## 2016-08-04 DIAGNOSIS — I714 Abdominal aortic aneurysm, without rupture, unspecified: Secondary | ICD-10-CM

## 2016-08-04 DIAGNOSIS — Z48812 Encounter for surgical aftercare following surgery on the circulatory system: Secondary | ICD-10-CM | POA: Diagnosis not present

## 2016-08-04 NOTE — Progress Notes (Signed)
[facility]  Patient name: Jerry Gill MRN: [id number] DOB: 12/24/1960 Sex: male  REASON FOR VISIT: Follow-up stent graft repair of abdominal aortic aneurysm  HPI: Jerry Cornhomas Lobato is a 55 y.o. male here today for follow-up. Underwent stent graft repair of abdominal aortic aneurysm in April 2017. He continues to do well with no new major medical problems. Fortunately continues to smoke cigarettes. He does not have any cardiac disease.  Past Medical History:  Diagnosis Date  . AAA (abdominal aortic aneurysm) (HCC)   . Collagen vascular disease (HCC)   . DDD (degenerative disc disease), lumbar   . Hypertension     Family History  Problem Relation Age of Onset  . Heart disease Father     before age 57  . AAA (abdominal aortic aneurysm) Father   . Cancer Father   . Heart disease Mother     before age 57  . Hypertension Mother   . Hyperlipidemia Brother     SOCIAL HISTORY: Social History  Substance Use Topics  . Smoking status: Current Some Day Smoker    Packs/day: 0.50    Years: 47.00    Types: Cigarettes  . Smokeless tobacco: Never Used  . Alcohol use No    Allergies  Allergen Reactions  . Codeine Nausea And Vomiting  . Penicillins Itching and Rash    Has patient had a PCN reaction causing immediate rash, facial/tongue/throat swelling, SOB or lightheadedness with hypotension: Unknown, childhood reaction Has patient had a PCN reaction causing severe rash involving mucus membranes or skin necrosis: No Has patient had a PCN reaction that required hospitalization No Has patient had a PCN reaction occurring within the last 10 years: No If all of the above answers are "NO", then may proceed with Cephalosporin use.   . Sulfa Antibiotics Itching    Current Outpatient Prescriptions  Medication Sig Dispense Refill  . albuterol (PROVENTIL HFA;VENTOLIN HFA) 108 (90 Base) MCG/ACT inhaler Inhale 2 puffs into the  lungs every 4 (four) hours as needed for wheezing or shortness of breath. 1 Inhaler 0  . aspirin EC 81 MG tablet Take 81 mg by mouth every evening.    Marland Kitchen. doxycycline (VIBRAMYCIN) 100 MG capsule Take 1 capsule (100 mg total) by mouth 2 (two) times daily. 20 capsule 0  . fenofibrate micronized (LOFIBRA) 200 MG capsule Take 100 mg by mouth every evening.     . ferrous sulfate 325 (65 FE) MG tablet Take 325 mg by mouth every evening.     . fluticasone (FLONASE) 50 MCG/ACT nasal spray Place 1 spray into both nostrils daily as needed for allergies or rhinitis.    . metoprolol succinate (TOPROL-XL) 100 MG 24 hr tablet Take 150 mg by mouth daily. Take with or immediately following a meal.    . Multiple Vitamins-Minerals (MULTIVITAMIN GUMMIES MENS PO) Take 1 tablet by mouth 2 (two) times daily.    . Omega-3 Fatty Acids (FISH OIL TRIPLE STRENGTH) 1400 MG CAPS Take 1 tablet by mouth daily.    . traMADol (ULTRAM) 50 MG tablet Take 1 tablet (50 mg total) by mouth every 6 (six) hours as needed. 30 tablet 0   No current facility-administered medications for this visit.     REVIEW OF SYSTEMS:  [X]  denotes positive finding, [ ]  denotes negative finding Cardiac  Comments:  Chest pain or chest pressure:     Shortness of breath upon exertion:    Short of breath when lying flat:  Irregular heart rhythm:        Vascular    Pain in calf, thigh, or hip brought on by ambulation:    Pain in feet at night that wakes you up from your sleep:     Blood clot in your veins:    Leg swelling:           PHYSICAL EXAM: Vitals:   08/04/16 1023  BP: 138/90  Pulse: 60  Resp: 14  Temp: 97.4 F (36.3 C)  TempSrc: Oral  SpO2: 99%  Weight: 156 lb (70.8 kg)  Height: 5' 10.5" (1.791 m)    GENERAL: The patient is a well-nourished male, in no acute distress. The vital signs are documented above. CARDIOVASCULAR: 2+ radial and 2+ posterior tibial pulses bilaterally. He does not have any pulsatile mass in his aorta. He  is thin.  PULMONARY: There is good air exchange  MUSCULOSKELETAL: There are no major deformities or cyanosis. NEUROLOGIC: No focal weakness or paresthesias are detected. SKIN: There are no ulcers or rashes noted. PSYCHIATRIC: The patient has a normal affect.  DATA:  Ultrasound today shows a diminished size of his aneurysm sac from 5.0 cm by CT in May 2017 24.7 cm maximal diameter currently  MEDICAL ISSUES: Stable follow-up after stent graft repair. We'll continue his usual activities. Again encouraged on smoking cessation. He will see our nurse practitioner in year for ultrasound follow-up of his stent graft repair    Larina Earthlyodd F. Rylynn Schoneman, MD [facility] Office Tel [phone] Pager [phone]

## 2016-08-05 NOTE — Addendum Note (Signed)
Addended by: Burton ApleyPETTY, Tiernan Suto A on: 08/05/2016 02:22 PM   Modules accepted: Orders

## 2017-02-05 ENCOUNTER — Ambulatory Visit (INDEPENDENT_AMBULATORY_CARE_PROVIDER_SITE_OTHER): Payer: Medicaid Other | Admitting: Physician Assistant

## 2017-02-05 ENCOUNTER — Encounter: Payer: Self-pay | Admitting: Physician Assistant

## 2017-02-05 VITALS — BP 133/86 | HR 66 | Temp 97.0°F | Ht 70.5 in | Wt 159.6 lb

## 2017-02-05 DIAGNOSIS — M5030 Other cervical disc degeneration, unspecified cervical region: Secondary | ICD-10-CM | POA: Diagnosis not present

## 2017-02-05 DIAGNOSIS — Z720 Tobacco use: Secondary | ICD-10-CM

## 2017-02-05 DIAGNOSIS — E785 Hyperlipidemia, unspecified: Secondary | ICD-10-CM | POA: Diagnosis not present

## 2017-02-05 DIAGNOSIS — I10 Essential (primary) hypertension: Secondary | ICD-10-CM

## 2017-02-05 DIAGNOSIS — I714 Abdominal aortic aneurysm, without rupture, unspecified: Secondary | ICD-10-CM

## 2017-02-05 DIAGNOSIS — M5136 Other intervertebral disc degeneration, lumbar region: Secondary | ICD-10-CM

## 2017-02-05 DIAGNOSIS — N529 Male erectile dysfunction, unspecified: Secondary | ICD-10-CM | POA: Diagnosis not present

## 2017-02-05 MED ORDER — METOPROLOL SUCCINATE ER 100 MG PO TB24: 200.0000 mg | ORAL_TABLET | Freq: Every day | ORAL | 3 refills | Status: DC

## 2017-02-05 MED ORDER — TADALAFIL 20 MG PO TABS: 10.0000 mg | ORAL_TABLET | ORAL | 11 refills | Status: DC | PRN

## 2017-02-05 MED ORDER — TRAMADOL HCL 50 MG PO TABS: 50.0000 mg | ORAL_TABLET | Freq: Four times a day (QID) | ORAL | 2 refills | Status: DC | PRN

## 2017-02-05 NOTE — Patient Instructions (Signed)
DASH Eating Plan DASH stands for "Dietary Approaches to Stop Hypertension." The DASH eating plan is a healthy eating plan that has been shown to reduce high blood pressure (hypertension). It may also reduce your risk for type 2 diabetes, heart disease, and stroke. The DASH eating plan may also help with weight loss. What are tips for following this plan? General guidelines  Avoid eating more than 2,300 mg (milligrams) of salt (sodium) a day. If you have hypertension, you may need to reduce your sodium intake to 1,500 mg a day.  Limit alcohol intake to no more than 1 drink a day for nonpregnant women and 2 drinks a day for men. One drink equals 12 oz of beer, 5 oz of wine, or 1 oz of hard liquor.  Work with your health care provider to maintain a healthy body weight or to lose weight. Ask what an ideal weight is for you.  Get at least 30 minutes of exercise that causes your heart to beat faster (aerobic exercise) most days of the week. Activities may include walking, swimming, or biking.  Work with your health care provider or diet and nutrition specialist (dietitian) to adjust your eating plan to your individual calorie needs. Reading food labels  Check food labels for the amount of sodium per serving. Choose foods with less than 5 percent of the Daily Value of sodium. Generally, foods with less than 300 mg of sodium per serving fit into this eating plan.  To find whole grains, look for the word "whole" as the first word in the ingredient list. Shopping  Buy products labeled as "low-sodium" or "no salt added."  Buy fresh foods. Avoid canned foods and premade or frozen meals. Cooking  Avoid adding salt when cooking. Use salt-free seasonings or herbs instead of table salt or sea salt. Check with your health care provider or pharmacist before using salt substitutes.  Do not fry foods. Cook foods using healthy methods such as baking, boiling, grilling, and broiling instead.  Cook with  heart-healthy oils, such as olive, canola, soybean, or sunflower oil. Meal planning   Eat a balanced diet that includes: ? 5 or more servings of fruits and vegetables each day. At each meal, try to fill half of your plate with fruits and vegetables. ? Up to 6-8 servings of whole grains each day. ? Less than 6 oz of lean meat, poultry, or fish each day. A 3-oz serving of meat is about the same size as a deck of cards. One egg equals 1 oz. ? 2 servings of low-fat dairy each day. ? A serving of nuts, seeds, or beans 5 times each week. ? Heart-healthy fats. Healthy fats called Omega-3 fatty acids are found in foods such as flaxseeds and coldwater fish, like sardines, salmon, and mackerel.  Limit how much you eat of the following: ? Canned or prepackaged foods. ? Food that is high in trans fat, such as fried foods. ? Food that is high in saturated fat, such as fatty meat. ? Sweets, desserts, sugary drinks, and other foods with added sugar. ? Full-fat dairy products.  Do not salt foods before eating.  Try to eat at least 2 vegetarian meals each week.  Eat more home-cooked food and less restaurant, buffet, and fast food.  When eating at a restaurant, ask that your food be prepared with less salt or no salt, if possible. What foods are recommended? The items listed may not be a complete list. Talk with your dietitian about what   dietary choices are best for you. Grains Whole-grain or whole-wheat bread. Whole-grain or whole-wheat pasta. Brown rice. Oatmeal. Quinoa. Bulgur. Whole-grain and low-sodium cereals. Pita bread. Low-fat, low-sodium crackers. Whole-wheat flour tortillas. Vegetables Fresh or frozen vegetables (raw, steamed, roasted, or grilled). Low-sodium or reduced-sodium tomato and vegetable juice. Low-sodium or reduced-sodium tomato sauce and tomato paste. Low-sodium or reduced-sodium canned vegetables. Fruits All fresh, dried, or frozen fruit. Canned fruit in natural juice (without  added sugar). Meat and other protein foods Skinless chicken or turkey. Ground chicken or turkey. Pork with fat trimmed off. Fish and seafood. Egg whites. Dried beans, peas, or lentils. Unsalted nuts, nut butters, and seeds. Unsalted canned beans. Lean cuts of beef with fat trimmed off. Low-sodium, lean deli meat. Dairy Low-fat (1%) or fat-free (skim) milk. Fat-free, low-fat, or reduced-fat cheeses. Nonfat, low-sodium ricotta or cottage cheese. Low-fat or nonfat yogurt. Low-fat, low-sodium cheese. Fats and oils Soft margarine without trans fats. Vegetable oil. Low-fat, reduced-fat, or light mayonnaise and salad dressings (reduced-sodium). Canola, safflower, olive, soybean, and sunflower oils. Avocado. Seasoning and other foods Herbs. Spices. Seasoning mixes without salt. Unsalted popcorn and pretzels. Fat-free sweets. What foods are not recommended? The items listed may not be a complete list. Talk with your dietitian about what dietary choices are best for you. Grains Baked goods made with fat, such as croissants, muffins, or some breads. Dry pasta or rice meal packs. Vegetables Creamed or fried vegetables. Vegetables in a cheese sauce. Regular canned vegetables (not low-sodium or reduced-sodium). Regular canned tomato sauce and paste (not low-sodium or reduced-sodium). Regular tomato and vegetable juice (not low-sodium or reduced-sodium). Pickles. Olives. Fruits Canned fruit in a light or heavy syrup. Fried fruit. Fruit in cream or butter sauce. Meat and other protein foods Fatty cuts of meat. Ribs. Fried meat. Bacon. Sausage. Bologna and other processed lunch meats. Salami. Fatback. Hotdogs. Bratwurst. Salted nuts and seeds. Canned beans with added salt. Canned or smoked fish. Whole eggs or egg yolks. Chicken or turkey with skin. Dairy Whole or 2% milk, cream, and half-and-half. Whole or full-fat cream cheese. Whole-fat or sweetened yogurt. Full-fat cheese. Nondairy creamers. Whipped toppings.  Processed cheese and cheese spreads. Fats and oils Butter. Stick margarine. Lard. Shortening. Ghee. Bacon fat. Tropical oils, such as coconut, palm kernel, or palm oil. Seasoning and other foods Salted popcorn and pretzels. Onion salt, garlic salt, seasoned salt, table salt, and sea salt. Worcestershire sauce. Tartar sauce. Barbecue sauce. Teriyaki sauce. Soy sauce, including reduced-sodium. Steak sauce. Canned and packaged gravies. Fish sauce. Oyster sauce. Cocktail sauce. Horseradish that you find on the shelf. Ketchup. Mustard. Meat flavorings and tenderizers. Bouillon cubes. Hot sauce and Tabasco sauce. Premade or packaged marinades. Premade or packaged taco seasonings. Relishes. Regular salad dressings. Where to find more information:  National Heart, Lung, and Blood Institute: www.nhlbi.nih.gov  American Heart Association: www.heart.org Summary  The DASH eating plan is a healthy eating plan that has been shown to reduce high blood pressure (hypertension). It may also reduce your risk for type 2 diabetes, heart disease, and stroke.  With the DASH eating plan, you should limit salt (sodium) intake to 2,300 mg a day. If you have hypertension, you may need to reduce your sodium intake to 1,500 mg a day.  When on the DASH eating plan, aim to eat more fresh fruits and vegetables, whole grains, lean proteins, low-fat dairy, and heart-healthy fats.  Work with your health care provider or diet and nutrition specialist (dietitian) to adjust your eating plan to your individual   calorie needs. This information is not intended to replace advice given to you by your health care provider. Make sure you discuss any questions you have with your health care provider. Document Released: 08/20/2011 Document Revised: 08/24/2016 Document Reviewed: 08/24/2016 Elsevier Interactive Patient Education  2017 Elsevier Inc.  
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
## 2017-03-02 ENCOUNTER — Other Ambulatory Visit: Payer: Self-pay | Admitting: Physician Assistant

## 2017-03-03 NOTE — Telephone Encounter (Signed)
[facility] Judie G[facility]Bryan at [facility], cialis is not covered by [employer] insurance.  Patient requesting to go back on Sildenafil.

## 2017-04-08 ENCOUNTER — Ambulatory Visit (INDEPENDENT_AMBULATORY_CARE_PROVIDER_SITE_OTHER): Payer: Medicaid Other | Admitting: Specialist

## 2017-05-10 ENCOUNTER — Ambulatory Visit: Payer: Medicaid Other | Admitting: Physician Assistant

## 2017-05-12 ENCOUNTER — Ambulatory Visit (INDEPENDENT_AMBULATORY_CARE_PROVIDER_SITE_OTHER): Payer: Medicaid Other | Admitting: Physician Assistant

## 2017-05-12 ENCOUNTER — Encounter: Payer: Self-pay | Admitting: Physician Assistant

## 2017-05-12 VITALS — BP 129/88 | HR 74 | Temp 97.9°F | Ht 70.5 in | Wt 158.6 lb

## 2017-05-12 DIAGNOSIS — M5030 Other cervical disc degeneration, unspecified cervical region: Secondary | ICD-10-CM

## 2017-05-12 DIAGNOSIS — F17219 Nicotine dependence, cigarettes, with unspecified nicotine-induced disorders: Secondary | ICD-10-CM

## 2017-05-12 DIAGNOSIS — Z9889 Other specified postprocedural states: Secondary | ICD-10-CM | POA: Diagnosis not present

## 2017-05-12 DIAGNOSIS — I10 Essential (primary) hypertension: Secondary | ICD-10-CM | POA: Diagnosis not present

## 2017-05-12 DIAGNOSIS — M5136 Other intervertebral disc degeneration, lumbar region: Secondary | ICD-10-CM

## 2017-05-12 DIAGNOSIS — E785 Hyperlipidemia, unspecified: Secondary | ICD-10-CM | POA: Diagnosis not present

## 2017-05-12 DIAGNOSIS — Z0000 Encounter for general adult medical examination without abnormal findings: Secondary | ICD-10-CM

## 2017-05-12 MED ORDER — FLUTICASONE PROPIONATE 50 MCG/ACT NA SUSP: 1.0000 | Freq: Every day | NASAL | 6 refills | Status: DC | PRN

## 2017-05-12 MED ORDER — CLOBETASOL PROPIONATE 0.05 % EX SOLN: 1.0000 "application " | Freq: Two times a day (BID) | CUTANEOUS | 0 refills | Status: DC

## 2017-05-12 MED ORDER — SILDENAFIL CITRATE 20 MG PO TABS: ORAL_TABLET | ORAL | 5 refills | Status: DC

## 2017-05-12 MED ORDER — RANITIDINE HCL 300 MG PO TABS: 300.0000 mg | ORAL_TABLET | Freq: Every day | ORAL | 5 refills | Status: DC

## 2017-05-12 MED ORDER — TRAMADOL HCL 50 MG PO TABS: 50.0000 mg | ORAL_TABLET | Freq: Four times a day (QID) | ORAL | 2 refills | Status: DC | PRN

## 2017-05-12 MED ORDER — METOPROLOL SUCCINATE ER 100 MG PO TB24: 200.0000 mg | ORAL_TABLET | Freq: Every day | ORAL | 3 refills | Status: DC

## 2017-05-12 MED ORDER — ALBUTEROL SULFATE HFA 108 (90 BASE) MCG/ACT IN AERS: 2.0000 | INHALATION_SPRAY | RESPIRATORY_TRACT | 0 refills | Status: DC | PRN

## 2017-05-12 NOTE — Patient Instructions (Signed)
In a few days you may receive a survey in the mail or online from [employer] regarding your visit with us today. Please take a moment to fill this out. Your feedback is very important to our whole office. It can help us better understand your needs as well as improve your experience and satisfaction. Thank you for taking your time to complete it. We care about you.  Hideko Esselman, PA-C  

## 2017-05-13 LAB — CMP14+EGFR
ALK PHOS: 62 IU/L (ref 39–117)
ALT: 12 IU/L (ref 0–44)
AST: 14 IU/L (ref 0–40)
Albumin/Globulin Ratio: 1.7 (ref 1.2–2.2)
Albumin: 4.5 g/dL (ref 3.5–5.5)
BUN/Creatinine Ratio: 15 (ref 9–20)
BUN: 12 mg/dL (ref 6–24)
Bilirubin Total: <0.2 mg/dL (ref 0.0–1.2)
CALCIUM: 9.5 mg/dL (ref 8.7–10.2)
CHLORIDE: 103 mmol/L (ref 96–106)
CO2: 21 mmol/L (ref 20–29)
CREATININE: 0.82 mg/dL (ref 0.76–1.27)
GFR calc Af Amer: 113 mL/min/{1.73_m2} (ref >59)
GFR calc non Af Amer: 97 mL/min/{1.73_m2} (ref >59)
GLOBULIN, TOTAL: 2.6 g/dL (ref 1.5–4.5)
GLUCOSE: 90 mg/dL (ref 65–99)
Potassium: 4.6 mmol/L (ref 3.5–5.2)
SODIUM: 140 mmol/L (ref 134–144)
Total Protein: 7.1 g/dL (ref 6.0–8.5)

## 2017-05-13 LAB — CBC WITH DIFFERENTIAL/PLATELET
BASOS ABS: 0 10*3/uL (ref 0.0–0.2)
Basos: 0 %
EOS (ABSOLUTE): 0.3 10*3/uL (ref 0.0–0.4)
Eos: 3 %
Hematocrit: 44.7 % (ref 37.5–51.0)
Hemoglobin: 14.9 g/dL (ref 13.0–17.7)
IMMATURE GRANULOCYTES: 0 %
Immature Grans (Abs): 0 10*3/uL (ref 0.0–0.1)
LYMPHS ABS: 2.1 10*3/uL (ref 0.7–3.1)
Lymphs: 23 %
MCH: 29.9 pg (ref 26.6–33.0)
MCHC: 33.3 g/dL (ref 31.5–35.7)
MCV: 90 fL (ref 79–97)
MONOS ABS: 0.6 10*3/uL (ref 0.1–0.9)
Monocytes: 7 %
NEUTROS PCT: 67 %
Neutrophils Absolute: 6.1 10*3/uL (ref 1.4–7.0)
PLATELETS: 298 10*3/uL (ref 150–379)
RBC: 4.98 x10E6/uL (ref 4.14–5.80)
RDW: 13.9 % (ref 12.3–15.4)
WBC: 9.2 10*3/uL (ref 3.4–10.8)

## 2017-05-13 LAB — PSA: Prostate Specific Ag, Serum: 0.3 ng/mL (ref 0.0–4.0)

## 2017-05-13 LAB — LIPID PANEL
CHOLESTEROL TOTAL: 172 mg/dL (ref 100–199)
Chol/HDL Ratio: 6.6 ratio — ABNORMAL HIGH (ref 0.0–5.0)
HDL: 26 mg/dL — ABNORMAL LOW (ref >39)
LDL CALC: 95 mg/dL (ref 0–99)
TRIGLYCERIDES: 253 mg/dL — AB (ref 0–149)
VLDL Cholesterol Cal: 51 mg/dL — ABNORMAL HIGH (ref 5–40)

## 2017-05-17 NOTE — Progress Notes (Signed)
BP 129/88   Pulse 74   Temp 97.9 F (36.6 C) (Oral)   Ht 5' 10.5" (1.791 m)   Wt 158 lb 9.6 oz (71.9 kg)   BMI 22.44 kg/m    Subjective:    Patient ID: Jerry Gill, male    DOB: May 30, 1961, 56 y.o.   MRN: [id number]  HPI: Jerry Gill is a 56 y.o. male presenting on 05/12/2017 for Follow-up (3 month)  This patient comes in for periodic recheck on medications and conditions including hyperlipid, DDD, hypertension, known history of AAA repair, smoker. He has no other complaints at this time.  Needs refills and labs performed.   All medications are reviewed today. There are no reports of any problems with the medications. All of the medical conditions are reviewed and updated.  Lab work is reviewed and will be ordered as medically necessary. There are no new problems reported with today's visit.   Relevant past medical, surgical, family and social history reviewed and updated as indicated. Allergies and medications reviewed and updated.  Past Medical History:  Diagnosis Date  . AAA (abdominal aortic aneurysm) ([place])   . Collagen vascular disease ([place])   . DDD (degenerative disc disease), lumbar   . Hypertension     Past Surgical History:  Procedure Laterality Date  . ABDOMINAL AORTIC ENDOVASCULAR STENT GRAFT N/A 12/20/2015   Procedure: ABDOMINAL AORTIC ENDOVASCULAR STENT GRAFT;  Surgeon: Rosetta Posner, MD;  Location: [facility];  Service: Vascular;  Laterality: N/A;  . hemmrroidectomy    . UMBILICAL HERNIA REPAIR     as infant      Review of Systems  Constitutional: Negative.  Negative for appetite change and fatigue.  HENT: Negative.   Eyes: Negative.  Negative for pain and visual disturbance.  Respiratory: Negative.  Negative for cough, chest tightness, shortness of breath and wheezing.   Cardiovascular: Negative.  Negative for chest pain, palpitations and leg swelling.  Gastrointestinal: Negative.  Negative for abdominal pain, diarrhea, nausea and vomiting.  Endocrine:  Negative.   Genitourinary: Negative.   Musculoskeletal: Positive for arthralgias, back pain and neck pain.  Skin: Negative.  Negative for color change and rash.  Neurological: Negative.  Negative for weakness, numbness and headaches.  Psychiatric/Behavioral: Negative.     Allergies as of 05/12/2017      Reactions   Codeine Nausea And Vomiting   Penicillins Itching, Rash   Has patient had a PCN reaction causing immediate rash, facial/tongue/throat swelling, SOB or lightheadedness with hypotension: Unknown, childhood reaction Has patient had a PCN reaction causing severe rash involving mucus membranes or skin necrosis: No Has patient had a PCN reaction that required hospitalization No Has patient had a PCN reaction occurring within the last 10 years: No If all of the above answers are "NO", then may proceed with Cephalosporin use.   Sulfa Antibiotics Itching      Medication List       Accurate as of 05/12/17 11:59 PM. Always use your most recent med list.          albuterol 108 (90 Base) MCG/ACT inhaler Commonly known as:  PROVENTIL HFA;VENTOLIN HFA Inhale 2 puffs into the lungs every 4 (four) hours as needed for wheezing or shortness of breath.   aspirin EC 81 MG tablet Take 81 mg by mouth every evening.   clobetasol 0.05 % external solution Commonly known as:  TEMOVATE Apply 1 application topically 2 (two) times daily.   fluticasone 50 MCG/ACT nasal spray Commonly known as:  FLONASE Place 1 spray into both nostrils daily as needed for allergies or rhinitis.   metoprolol succinate 100 MG 24 hr tablet Commonly known as:  TOPROL-XL Take 2 tablets (200 mg total) by mouth daily. Take 1.5 tab most days   MULTIVITAMIN GUMMIES MENS PO Take 1 tablet by mouth 2 (two) times daily.   ranitidine 300 MG tablet Commonly known as:  ZANTAC Take 1 tablet (300 mg total) by mouth at bedtime.   sildenafil 20 MG tablet Commonly known as:  REVATIO TAKE 2-5 TABLETS AS NEEDED PRIOR TO  SEXUAL ACTIVITY   traMADol 50 MG tablet Commonly known as:  ULTRAM Take 1 tablet (50 mg total) by mouth every 6 (six) hours as needed.            Discharge Care Instructions        Start     Ordered   05/12/17 0000  CBC with Differential/Platelet     05/12/17 1525   05/12/17 0000  CMP14+EGFR     05/12/17 1525   05/12/17 0000  Lipid panel     05/12/17 1525   05/12/17 0000  PSA     05/12/17 1525   05/12/17 0000  albuterol (PROVENTIL HFA;VENTOLIN HFA) 108 (90 Base) MCG/ACT inhaler  Every 4 hours PRN    Question:  Supervising Provider  Answer:  Timmothy Euler   05/12/17 1525   05/12/17 0000  fluticasone (FLONASE) 50 MCG/ACT nasal spray  Daily PRN    Question:  Supervising Provider  Answer:  Timmothy Euler   05/12/17 1525   05/12/17 0000  metoprolol succinate (TOPROL-XL) 100 MG 24 hr tablet  Daily    Question:  Supervising Provider  Answer:  Timmothy Euler   05/12/17 1525   05/12/17 0000  traMADol (ULTRAM) 50 MG tablet  Every 6 hours PRN    Question:  Supervising Provider  Answer:  Timmothy Euler   05/12/17 1525   05/12/17 0000  ranitidine (ZANTAC) 300 MG tablet  Daily at bedtime    Question:  Supervising Provider  Answer:  Kenn File L   05/12/17 1525   05/12/17 0000  sildenafil (REVATIO) 20 MG tablet    Question:  Supervising Provider  Answer:  Kenn File L   05/12/17 1525   05/12/17 0000  clobetasol (TEMOVATE) 0.05 % external solution  2 times daily    Question:  Supervising Provider  Answer:  Timmothy Euler   05/12/17 1525         Objective:    BP 129/88   Pulse 74   Temp 97.9 F (36.6 C) (Oral)   Ht 5' 10.5" (1.791 m)   Wt 158 lb 9.6 oz (71.9 kg)   BMI 22.44 kg/m   Allergies  Allergen Reactions  . Codeine Nausea And Vomiting  . Penicillins Itching and Rash    Has patient had a PCN reaction causing immediate rash, facial/tongue/throat swelling, SOB or lightheadedness with hypotension: Unknown, childhood reaction Has  patient had a PCN reaction causing severe rash involving mucus membranes or skin necrosis: No Has patient had a PCN reaction that required hospitalization No Has patient had a PCN reaction occurring within the last 10 years: No If all of the above answers are "NO", then may proceed with Cephalosporin use.   . Sulfa Antibiotics Itching    Physical Exam  Constitutional: He appears well-developed and well-nourished.  HENT:  Head: Normocephalic and atraumatic.  Eyes: Pupils are equal, round, and reactive to light. Conjunctivae  and EOM are normal.  Neck: Normal range of motion. Neck supple.  Cardiovascular: Normal rate, regular rhythm and normal heart sounds.   Pulmonary/Chest: Effort normal and breath sounds normal.  Abdominal: Soft. Bowel sounds are normal.  Musculoskeletal: Normal range of motion.  Skin: Skin is warm and dry.  Nursing note and vitals reviewed.   Results for orders placed or performed in visit on 05/12/17  CBC with Differential/Platelet  Result Value Ref Range   WBC 9.2 3.4 - 10.8 x10E3/uL   RBC 4.98 4.14 - 5.80 x10E6/uL   Hemoglobin 14.9 13.0 - 17.7 g/dL   Hematocrit 44.7 37.5 - 51.0 %   MCV 90 79 - 97 fL   MCH 29.9 26.6 - 33.0 pg   MCHC 33.3 31.5 - 35.7 g/dL   RDW 13.9 12.3 - 15.4 %   Platelets 298 150 - 379 x10E3/uL   Neutrophils 67 Not Estab. %   Lymphs 23 Not Estab. %   Monocytes 7 Not Estab. %   Eos 3 Not Estab. %   Basos 0 Not Estab. %   Neutrophils Absolute 6.1 1.4 - 7.0 x10E3/uL   Lymphocytes Absolute 2.1 0.7 - 3.1 x10E3/uL   Monocytes Absolute 0.6 0.1 - 0.9 x10E3/uL   EOS (ABSOLUTE) 0.3 0.0 - 0.4 x10E3/uL   Basophils Absolute 0.0 0.0 - 0.2 x10E3/uL   Immature Granulocytes 0 Not Estab. %   Immature Grans (Abs) 0.0 0.0 - 0.1 x10E3/uL  CMP14+EGFR  Result Value Ref Range   Glucose 90 65 - 99 mg/dL   BUN 12 6 - 24 mg/dL   Creatinine, Ser 0.82 0.76 - 1.27 mg/dL   GFR calc non Af Amer 97 >59 mL/min/1.73   GFR calc Af Amer 113 >59 mL/min/1.73    BUN/Creatinine Ratio 15 9 - 20   Sodium 140 134 - 144 mmol/L   Potassium 4.6 3.5 - 5.2 mmol/L   Chloride 103 96 - 106 mmol/L   CO2 21 20 - 29 mmol/L   Calcium 9.5 8.7 - 10.2 mg/dL   Total Protein 7.1 6.0 - 8.5 g/dL   Albumin 4.5 3.5 - 5.5 g/dL   Globulin, Total 2.6 1.5 - 4.5 g/dL   Albumin/Globulin Ratio 1.7 1.2 - 2.2   Bilirubin Total <0.2 0.0 - 1.2 mg/dL   Alkaline Phosphatase 62 39 - 117 IU/L   AST 14 0 - 40 IU/L   ALT 12 0 - 44 IU/L  Lipid panel  Result Value Ref Range   Cholesterol, Total 172 100 - 199 mg/dL   Triglycerides 253 (H) 0 - 149 mg/dL   HDL 26 (L) >39 mg/dL   VLDL Cholesterol Cal 51 (H) 5 - 40 mg/dL   LDL Calculated 95 0 - 99 mg/dL   Chol/HDL Ratio 6.6 (H) 0.0 - 5.0 ratio  PSA  Result Value Ref Range   Prostate Specific Ag, Serum 0.3 0.0 - 4.0 ng/mL      Assessment & Plan:   1. Dyslipidemia - Lipid panel  2. DDD (degenerative disc disease), lumbar - PSA - traMADol (ULTRAM) 50 MG tablet; Take 1 tablet (50 mg total) by mouth every 6 (six) hours as needed.  Dispense: 120 tablet; Refill: 2  3. Essential hypertension - CBC with Differential/Platelet - CMP14+EGFR - Lipid panel - metoprolol succinate (TOPROL-XL) 100 MG 24 hr tablet; Take 2 tablets (200 mg total) by mouth daily. Take 1.5 tab most days  Dispense: 180 tablet; Refill: 3  4. History of AAA (abdominal aortic aneurysm) repair - CBC  with Differential/Platelet - CMP14+EGFR - Lipid panel  5. Cigarette nicotine dependence with nicotine-induced disorder  6. DDD (degenerative disc disease), cervical - traMADol (ULTRAM) 50 MG tablet; Take 1 tablet (50 mg total) by mouth every 6 (six) hours as needed.  Dispense: 120 tablet; Refill: 2  7. Well adult exam - PSA    Current Outpatient Prescriptions:  .  albuterol (PROVENTIL HFA;VENTOLIN HFA) 108 (90 Base) MCG/ACT inhaler, Inhale 2 puffs into the lungs every 4 (four) hours as needed for wheezing or shortness of breath., Disp: 1 Inhaler, Rfl: 0 .   aspirin EC 81 MG tablet, Take 81 mg by mouth every evening., Disp: , Rfl:  .  fluticasone (FLONASE) 50 MCG/ACT nasal spray, Place 1 spray into both nostrils daily as needed for allergies or rhinitis., Disp: 16 g, Rfl: 6 .  metoprolol succinate (TOPROL-XL) 100 MG 24 hr tablet, Take 2 tablets (200 mg total) by mouth daily. Take 1.5 tab most days, Disp: 180 tablet, Rfl: 3 .  Multiple Vitamins-Minerals (MULTIVITAMIN GUMMIES MENS PO), Take 1 tablet by mouth 2 (two) times daily., Disp: , Rfl:  .  sildenafil (REVATIO) 20 MG tablet, TAKE 2-5 TABLETS AS NEEDED PRIOR TO SEXUAL ACTIVITY, Disp: 90 tablet, Rfl: 5 .  traMADol (ULTRAM) 50 MG tablet, Take 1 tablet (50 mg total) by mouth every 6 (six) hours as needed., Disp: 120 tablet, Rfl: 2 .  clobetasol (TEMOVATE) 0.05 % external solution, Apply 1 application topically 2 (two) times daily., Disp: 50 mL, Rfl: 0 .  ranitidine (ZANTAC) 300 MG tablet, Take 1 tablet (300 mg total) by mouth at bedtime., Disp: 30 tablet, Rfl: 5 Continue all other maintenance medications as listed above.  Follow up plan: Return in about 3 months (around 08/12/2017) for recheck.  Educational handout given for [place] PA-C [place] [address]  [place], [place] [address] [phone]   05/17/2017, 2:36 PM

## 2017-07-25 ENCOUNTER — Encounter (HOSPITAL_COMMUNITY): Payer: Self-pay | Admitting: Emergency Medicine

## 2017-07-25 ENCOUNTER — Emergency Department (HOSPITAL_COMMUNITY)
Admission: EM | Admit: 2017-07-25 | Discharge: 2017-07-25 | Disposition: A | Discharge status: Home / Self Care | Payer: Medicaid Other | Attending: Emergency Medicine | Admitting: Emergency Medicine

## 2017-07-25 DIAGNOSIS — M25511 Pain in right shoulder: Secondary | ICD-10-CM | POA: Diagnosis not present

## 2017-07-25 DIAGNOSIS — R0789 Other chest pain: Secondary | ICD-10-CM | POA: Diagnosis not present

## 2017-07-25 DIAGNOSIS — Z7982 Long term (current) use of aspirin: Secondary | ICD-10-CM | POA: Diagnosis not present

## 2017-07-25 DIAGNOSIS — R55 Syncope and collapse: Secondary | ICD-10-CM | POA: Insufficient documentation

## 2017-07-25 DIAGNOSIS — F17210 Nicotine dependence, cigarettes, uncomplicated: Secondary | ICD-10-CM | POA: Diagnosis not present

## 2017-07-25 DIAGNOSIS — G8929 Other chronic pain: Secondary | ICD-10-CM | POA: Diagnosis not present

## 2017-07-25 DIAGNOSIS — I10 Essential (primary) hypertension: Secondary | ICD-10-CM | POA: Diagnosis not present

## 2017-07-25 DIAGNOSIS — Z79899 Other long term (current) drug therapy: Secondary | ICD-10-CM | POA: Insufficient documentation

## 2017-07-25 LAB — BASIC METABOLIC PANEL
Anion gap: 9 (ref 5–15)
BUN: 6 mg/dL (ref 6–20)
CHLORIDE: 102 mmol/L (ref 101–111)
CO2: 26 mmol/L (ref 22–32)
Calcium: 9.2 mg/dL (ref 8.9–10.3)
Creatinine, Ser: 0.85 mg/dL (ref 0.61–1.24)
GFR calc non Af Amer: >60 mL/min (ref >60)
Glucose, Bld: 104 mg/dL — ABNORMAL HIGH (ref 65–99)
POTASSIUM: 4.1 mmol/L (ref 3.5–5.1)
SODIUM: 137 mmol/L (ref 135–145)

## 2017-07-25 LAB — CBC WITH DIFFERENTIAL/PLATELET
Basophils Absolute: 0 10*3/uL (ref 0.0–0.1)
Basophils Relative: 0 %
EOS ABS: 0.1 10*3/uL (ref 0.0–0.7)
Eosinophils Relative: 1 %
HEMATOCRIT: 42.7 % (ref 39.0–52.0)
HEMOGLOBIN: 14.5 g/dL (ref 13.0–17.0)
LYMPHS ABS: 1.9 10*3/uL (ref 0.7–4.0)
Lymphocytes Relative: 17 %
MCH: 30.5 pg (ref 26.0–34.0)
MCHC: 34 g/dL (ref 30.0–36.0)
MCV: 89.7 fL (ref 78.0–100.0)
MONOS PCT: 6 %
Monocytes Absolute: 0.7 10*3/uL (ref 0.1–1.0)
NEUTROS ABS: 8.6 10*3/uL — AB (ref 1.7–7.7)
NEUTROS PCT: 76 %
Platelets: 263 10*3/uL (ref 150–400)
RBC: 4.76 MIL/uL (ref 4.22–5.81)
RDW: 13.6 % (ref 11.5–15.5)
WBC: 11.3 10*3/uL — ABNORMAL HIGH (ref 4.0–10.5)

## 2017-07-25 LAB — I-STAT TROPONIN, ED: Troponin i, poc: 0 ng/mL (ref 0.00–0.08)
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
## 2017-07-25 NOTE — Discharge Instructions (Signed)
Drink plenty of fluids and get plenty of rest.  Follow-up with your primary care physician for reevaluation of your symptoms.  Return if any concerning signs or symptoms develop.

## 2017-07-25 NOTE — ED Notes (Signed)
Pt departed in NAD, refused use of wheelchair.  
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
## 2017-08-10 ENCOUNTER — Ambulatory Visit: Payer: Medicaid Other | Admitting: Family

## 2017-08-10 ENCOUNTER — Ambulatory Visit (HOSPITAL_COMMUNITY): Payer: Medicaid Other

## 2017-08-13 ENCOUNTER — Ambulatory Visit: Payer: Medicaid Other | Admitting: Physician Assistant

## 2017-08-13 ENCOUNTER — Encounter: Payer: Self-pay | Admitting: Physician Assistant

## 2017-08-13 VITALS — BP 126/83 | HR 83 | Ht 71.0 in | Wt 165.0 lb

## 2017-08-13 DIAGNOSIS — M5030 Other cervical disc degeneration, unspecified cervical region: Secondary | ICD-10-CM | POA: Diagnosis not present

## 2017-08-13 DIAGNOSIS — I10 Essential (primary) hypertension: Secondary | ICD-10-CM | POA: Diagnosis not present

## 2017-08-13 DIAGNOSIS — M5136 Other intervertebral disc degeneration, lumbar region: Secondary | ICD-10-CM | POA: Diagnosis not present

## 2017-08-13 DIAGNOSIS — E785 Hyperlipidemia, unspecified: Secondary | ICD-10-CM | POA: Diagnosis not present

## 2017-08-13 MED ORDER — CLOBETASOL PROPIONATE 0.05 % EX SOLN: 1.0000 "application " | Freq: Two times a day (BID) | CUTANEOUS | 11 refills | Status: DC

## 2017-08-13 MED ORDER — TRAMADOL HCL 50 MG PO TABS: 50.0000 mg | ORAL_TABLET | Freq: Four times a day (QID) | ORAL | 5 refills | Status: DC | PRN

## 2017-08-13 NOTE — Patient Instructions (Signed)
In a few days you may receive a survey in the mail or online from [employer] regarding your visit with us today. Please take a moment to fill this out. Your feedback is very important to our whole office. It can help us better understand your needs as well as improve your experience and satisfaction. Thank you for taking your time to complete it. We care about you.  Tarik Teixeira, PA-C  

## 2017-08-16 NOTE — Progress Notes (Signed)
BP 126/83   Pulse 83   Ht 5\' 11"  (1.803 m)   Wt 165 lb (74.8 kg)   BMI 23.01 kg/m    Subjective:    Patient ID: Jerry Gill, male    DOB: October 29, 1960, 56 y.o.   MRN: [id number]  HPI: Jerry Gill is a 56 y.o. male presenting on 08/13/2017 for Follow-up (3 month )  This patient comes in for periodic recheck on medications and conditions including DDD, hypertension, dyslipidemia.  He will have labs performed.  Overall he has been doing fairly well.  He complains most of low back pain without radiation down the legs.  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  He feels overall he is quite stable at this time..   All medications are reviewed today. There are no reports of any problems with the medications. All of the medical conditions are reviewed and updated.  Lab work is reviewed and will be ordered as medically necessary. There are no new problems reported with today's visit.   Relevant past medical, surgical, family and social history reviewed and updated as indicated. Allergies and medications reviewed and updated.  Past Medical History:  Diagnosis Date  . AAA (abdominal aortic aneurysm) (HCC)   . Collagen vascular disease (HCC)   . DDD (degenerative disc disease), lumbar   . Hypertension     Past Surgical History:  Procedure Laterality Date  . ABDOMINAL AORTIC ENDOVASCULAR STENT GRAFT N/A 12/20/2015   Procedure: ABDOMINAL AORTIC ENDOVASCULAR STENT GRAFT;  Surgeon: Larina Earthly, MD;  Location: [facility] OR;  Service: Vascular;  Laterality: N/A;  . hemmrroidectomy    . UMBILICAL HERNIA REPAIR     as infant      Review of Systems  Constitutional: Negative.  Negative for appetite change and fatigue.  HENT: Negative.   Eyes: Negative.  Negative for pain and visual disturbance.  Respiratory: Negative.  Negative for cough, chest tightness, shortness of breath and wheezing.   Cardiovascular: Negative.  Negative for chest pain, palpitations and leg  swelling.  Gastrointestinal: Negative.  Negative for abdominal pain, diarrhea, nausea and vomiting.  Endocrine: Negative.   Genitourinary: Negative.   Musculoskeletal: Positive for arthralgias, back pain and myalgias. Negative for joint swelling.  Skin: Negative.  Negative for color change and rash.  Neurological: Negative.  Negative for weakness, numbness and headaches.  Psychiatric/Behavioral: Negative.     Allergies as of 08/13/2017      Reactions   Codeine Nausea And Vomiting   Penicillins Itching, Rash   Has patient had a PCN reaction causing immediate rash, facial/tongue/throat swelling, SOB or lightheadedness with hypotension: Unknown, childhood reaction Has patient had a PCN reaction causing severe rash involving mucus membranes or skin necrosis: No Has patient had a PCN reaction that required hospitalization No Has patient had a PCN reaction occurring within the last 10 years: No If all of the above answers are "NO", then may proceed with Cephalosporin use.   Sulfa Antibiotics Itching      Medication List        Accurate as of 08/13/17 11:59 PM. Always use your most recent med list.          albuterol 108 (90 Base) MCG/ACT inhaler Commonly known as:  PROVENTIL HFA;VENTOLIN HFA Inhale 2 puffs into the lungs every 4 (four) hours as needed for wheezing or shortness of breath.   aspirin EC 81 MG tablet Take 81 mg by mouth every evening.   clobetasol 0.05 %  external solution Commonly known as:  TEMOVATE Apply 1 application topically 2 (two) times daily.   fluticasone 50 MCG/ACT nasal spray Commonly known as:  FLONASE Place 1 spray into both nostrils daily as needed for allergies or rhinitis.   metoprolol succinate 100 MG 24 hr tablet Commonly known as:  TOPROL-XL Take 2 tablets (200 mg total) by mouth daily. Take 1.5 tab most days   MULTIVITAMIN GUMMIES MENS PO Take 1 tablet by mouth 2 (two) times daily.   ranitidine 300 MG tablet Commonly known as:   ZANTAC Take 1 tablet (300 mg total) by mouth at bedtime.   sildenafil 20 MG tablet Commonly known as:  REVATIO TAKE 2-5 TABLETS AS NEEDED PRIOR TO SEXUAL ACTIVITY   traMADol 50 MG tablet Commonly known as:  ULTRAM Take 1 tablet (50 mg total) by mouth every 6 (six) hours as needed.          Objective:    BP 126/83   Pulse 83   Ht 5\' 11"  (1.803 m)   Wt 165 lb (74.8 kg)   BMI 23.01 kg/m   Allergies  Allergen Reactions  . Codeine Nausea And Vomiting  . Penicillins Itching and Rash    Has patient had a PCN reaction causing immediate rash, facial/tongue/throat swelling, SOB or lightheadedness with hypotension: Unknown, childhood reaction Has patient had a PCN reaction causing severe rash involving mucus membranes or skin necrosis: No Has patient had a PCN reaction that required hospitalization No Has patient had a PCN reaction occurring within the last 10 years: No If all of the above answers are "NO", then may proceed with Cephalosporin use.   . Sulfa Antibiotics Itching    Physical Exam  Constitutional: He appears well-developed and well-nourished.  HENT:  Head: Normocephalic and atraumatic.  Eyes: Conjunctivae and EOM are normal. Pupils are equal, round, and reactive to light.  Neck: Normal range of motion. Neck supple.  Cardiovascular: Normal rate, regular rhythm and normal heart sounds.  Pulmonary/Chest: Effort normal and breath sounds normal.  Abdominal: Soft. Bowel sounds are normal.  Musculoskeletal: Normal range of motion.  Skin: Skin is warm and dry.    Results for orders placed or performed during the hospital encounter of 07/25/17  CBC with Differential  Result Value Ref Range   WBC 11.3 (H) 4.0 - 10.5 K/uL   RBC 4.76 4.22 - 5.81 MIL/uL   Hemoglobin 14.5 13.0 - 17.0 g/dL   HCT 16.142.7 09.639.0 - 04.552.0 %   MCV 89.7 78.0 - 100.0 fL   MCH 30.5 26.0 - 34.0 pg   MCHC 34.0 30.0 - 36.0 g/dL   RDW 40.913.6 81.111.5 - 91.415.5 %   Platelets 263 150 - 400 K/uL   Neutrophils  Relative % 76 %   Neutro Abs 8.6 (H) 1.7 - 7.7 K/uL   Lymphocytes Relative 17 %   Lymphs Abs 1.9 0.7 - 4.0 K/uL   Monocytes Relative 6 %   Monocytes Absolute 0.7 0.1 - 1.0 K/uL   Eosinophils Relative 1 %   Eosinophils Absolute 0.1 0.0 - 0.7 K/uL   Basophils Relative 0 %   Basophils Absolute 0.0 0.0 - 0.1 K/uL  Basic metabolic panel  Result Value Ref Range   Sodium 137 135 - 145 mmol/L   Potassium 4.1 3.5 - 5.1 mmol/L   Chloride 102 101 - 111 mmol/L   CO2 26 22 - 32 mmol/L   Glucose, Bld 104 (H) 65 - 99 mg/dL   BUN 6 6 - 20  mg/dL   Creatinine, Ser 1.610.85 0.61 - 1.24 mg/dL   Calcium 9.2 8.9 - 09.610.3 mg/dL   GFR calc non Af Amer >60 >60 mL/min   GFR calc Af Amer >60 >60 mL/min   Anion gap 9 5 - 15  I-stat troponin, ED  Result Value Ref Range   Troponin i, poc 0.00 0.00 - 0.08 ng/mL   Comment 3              Assessment & Plan:   1. DDD (degenerative disc disease), lumbar - traMADol (ULTRAM) 50 MG tablet; Take 1 tablet (50 mg total) by mouth every 6 (six) hours as needed.  Dispense: 120 tablet; Refill: 5  2. DDD (degenerative disc disease), cervical - traMADol (ULTRAM) 50 MG tablet; Take 1 tablet (50 mg total) by mouth every 6 (six) hours as needed.  Dispense: 120 tablet; Refill: 5  3. Essential hypertension  4. Dyslipidemia    Current Outpatient Medications:  .  albuterol (PROVENTIL HFA;VENTOLIN HFA) 108 (90 Base) MCG/ACT inhaler, Inhale 2 puffs into the lungs every 4 (four) hours as needed for wheezing or shortness of breath., Disp: 1 Inhaler, Rfl: 0 .  aspirin EC 81 MG tablet, Take 81 mg by mouth every evening., Disp: , Rfl:  .  clobetasol (TEMOVATE) 0.05 % external solution, Apply 1 application topically 2 (two) times daily., Disp: 50 mL, Rfl: 11 .  fluticasone (FLONASE) 50 MCG/ACT nasal spray, Place 1 spray into both nostrils daily as needed for allergies or rhinitis., Disp: 16 g, Rfl: 6 .  metoprolol succinate (TOPROL-XL) 100 MG 24 hr tablet, Take 2 tablets (200 mg total)  by mouth daily. Take 1.5 tab most days, Disp: 180 tablet, Rfl: 3 .  Multiple Vitamins-Minerals (MULTIVITAMIN GUMMIES MENS PO), Take 1 tablet by mouth 2 (two) times daily., Disp: , Rfl:  .  ranitidine (ZANTAC) 300 MG tablet, Take 1 tablet (300 mg total) by mouth at bedtime., Disp: 30 tablet, Rfl: 5 .  sildenafil (REVATIO) 20 MG tablet, TAKE 2-5 TABLETS AS NEEDED PRIOR TO SEXUAL ACTIVITY, Disp: 90 tablet, Rfl: 5 .  traMADol (ULTRAM) 50 MG tablet, Take 1 tablet (50 mg total) by mouth every 6 (six) hours as needed., Disp: 120 tablet, Rfl: 5 Continue all other maintenance medications as listed above.  Follow up plan: Return in about 6 months (around 02/10/2018).  Educational handout given for survey  Remus LofflerAngel S. Jordie Schreur PA-C [facility] [address]  [place], [place] [id number] [phone]   08/16/2017, 8:26 AM

## 2017-10-07 ENCOUNTER — Ambulatory Visit (INDEPENDENT_AMBULATORY_CARE_PROVIDER_SITE_OTHER): Payer: Medicaid Other | Admitting: Family

## 2017-10-07 ENCOUNTER — Encounter: Payer: Self-pay | Admitting: Family

## 2017-10-07 ENCOUNTER — Ambulatory Visit (HOSPITAL_COMMUNITY)
Admission: RE | Admit: 2017-10-07 | Discharge: 2017-10-07 | Disposition: A | Discharge status: Home / Self Care | Payer: Medicaid Other | Source: Ambulatory Visit | Attending: Family | Admitting: Family

## 2017-10-07 VITALS — BP 138/90 | HR 65 | Temp 97.9°F | Resp 20 | Ht 71.0 in | Wt 161.0 lb

## 2017-10-07 DIAGNOSIS — F17200 Nicotine dependence, unspecified, uncomplicated: Secondary | ICD-10-CM | POA: Diagnosis not present

## 2017-10-07 DIAGNOSIS — I714 Abdominal aortic aneurysm, without rupture, unspecified: Secondary | ICD-10-CM

## 2017-10-07 DIAGNOSIS — Z95828 Presence of other vascular implants and grafts: Secondary | ICD-10-CM

## 2017-10-07 NOTE — Patient Instructions (Signed)
Steps to Quit Smoking Smoking tobacco can be bad for your health. It can also affect almost every organ in your body. Smoking puts you and people around you at risk for many serious long-lasting (chronic) diseases. Quitting smoking is hard, but it is one of the best things that you can do for your health. It is never too late to quit. What are the benefits of quitting smoking? When you quit smoking, you lower your risk for getting serious diseases and conditions. They can include:  Lung cancer or lung disease.  Heart disease.  Stroke.  Heart attack.  Not being able to have children (infertility).  Weak bones (osteoporosis) and broken bones (fractures).  If you have coughing, wheezing, and shortness of breath, those symptoms may get better when you quit. You may also get sick less often. If you are pregnant, quitting smoking can help to lower your chances of having a baby of low birth weight. What can I do to help me quit smoking? Talk with your doctor about what can help you quit smoking. Some things you can do (strategies) include:  Quitting smoking totally, instead of slowly cutting back how much you smoke over a period of time.  Going to in-person counseling. You are more likely to quit if you go to many counseling sessions.  Using resources and support systems, such as: ? Online chats with a counselor. ? Phone quitlines. ? Printed self-help materials. ? Support groups or group counseling. ? Text messaging programs. ? Mobile phone apps or applications.  Taking medicines. Some of these medicines may have nicotine in them. If you are pregnant or breastfeeding, do not take any medicines to quit smoking unless your doctor says it is okay. Talk with your doctor about counseling or other things that can help you.  Talk with your doctor about using more than one strategy at the same time, such as taking medicines while you are also going to in-person counseling. This can help make  quitting easier. What things can I do to make it easier to quit? Quitting smoking might feel very hard at first, but there is a lot that you can do to make it easier. Take these steps:  Talk to your family and friends. Ask them to support and encourage you.  Call phone quitlines, reach out to support groups, or work with a counselor.  Ask people who smoke to not smoke around you.  Avoid places that make you want (trigger) to smoke, such as: ? Bars. ? Parties. ? Smoke-break areas at work.  Spend time with people who do not smoke.  Lower the stress in your life. Stress can make you want to smoke. Try these things to help your stress: ? Getting regular exercise. ? Deep-breathing exercises. ? Yoga. ? Meditating. ? Doing a body scan. To do this, close your eyes, focus on one area of your body at a time from head to toe, and notice which parts of your body are tense. Try to relax the muscles in those areas.  Download or buy apps on your mobile phone or tablet that can help you stick to your quit plan. There are many free apps, such as QuitGuide from the CDC (Centers for Disease Control and Prevention). You can find more support from smokefree.gov and other websites.  This information is not intended to replace advice given to you by your health care provider. Make sure you discuss any questions you have with your health care provider. Document Released: 06/27/2009 Document   Revised: 04/28/2016 Document Reviewed: 01/15/2015 Elsevier Interactive Patient Education  2018 Elsevier Inc.  
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
## 2017-11-12 ENCOUNTER — Other Ambulatory Visit: Payer: Self-pay | Admitting: Physician Assistant

## 2018-03-04 ENCOUNTER — Other Ambulatory Visit: Payer: Self-pay | Admitting: Physician Assistant

## 2018-03-04 DIAGNOSIS — I10 Essential (primary) hypertension: Secondary | ICD-10-CM

## 2018-03-15 ENCOUNTER — Encounter: Payer: Self-pay | Admitting: Physician Assistant

## 2018-03-15 ENCOUNTER — Ambulatory Visit (INDEPENDENT_AMBULATORY_CARE_PROVIDER_SITE_OTHER): Payer: Medicare HMO | Admitting: Physician Assistant

## 2018-03-15 VITALS — BP 131/89 | HR 67 | Temp 98.2°F | Ht 71.0 in | Wt 162.4 lb

## 2018-03-15 DIAGNOSIS — M5030 Other cervical disc degeneration, unspecified cervical region: Secondary | ICD-10-CM

## 2018-03-15 DIAGNOSIS — E785 Hyperlipidemia, unspecified: Secondary | ICD-10-CM

## 2018-03-15 DIAGNOSIS — M5136 Other intervertebral disc degeneration, lumbar region: Secondary | ICD-10-CM

## 2018-03-15 DIAGNOSIS — I10 Essential (primary) hypertension: Secondary | ICD-10-CM

## 2018-03-15 DIAGNOSIS — Z125 Encounter for screening for malignant neoplasm of prostate: Secondary | ICD-10-CM | POA: Diagnosis not present

## 2018-03-15 DIAGNOSIS — Z0000 Encounter for general adult medical examination without abnormal findings: Secondary | ICD-10-CM | POA: Diagnosis not present

## 2018-03-15 MED ORDER — TRAMADOL HCL 50 MG PO TABS: 50.0000 mg | ORAL_TABLET | Freq: Four times a day (QID) | ORAL | 5 refills | Status: DC | PRN

## 2018-03-15 NOTE — Patient Instructions (Signed)
In a few days you may receive a survey in the mail or online from [employer] regarding your visit with us today. Please take a moment to fill this out. Your feedback is very important to our whole office. It can help us better understand your needs as well as improve your experience and satisfaction. Thank you for taking your time to complete it. We care about you.  Alin Hutchins, PA-C  

## 2018-03-16 ENCOUNTER — Telehealth: Payer: Self-pay | Admitting: Physician Assistant

## 2018-03-16 LAB — CBC WITH DIFFERENTIAL/PLATELET
BASOS ABS: 0 10*3/uL (ref 0.0–0.2)
Basos: 0 %
EOS (ABSOLUTE): 0.2 10*3/uL (ref 0.0–0.4)
Eos: 3 %
Hematocrit: 41 % (ref 37.5–51.0)
Hemoglobin: 14 g/dL (ref 13.0–17.7)
Immature Grans (Abs): 0 10*3/uL (ref 0.0–0.1)
Immature Granulocytes: 0 %
LYMPHS ABS: 2.2 10*3/uL (ref 0.7–3.1)
LYMPHS: 33 %
MCH: 30 pg (ref 26.6–33.0)
MCHC: 34.1 g/dL (ref 31.5–35.7)
MCV: 88 fL (ref 79–97)
Monocytes Absolute: 0.4 10*3/uL (ref 0.1–0.9)
Monocytes: 6 %
NEUTROS ABS: 4 10*3/uL (ref 1.4–7.0)
Neutrophils: 58 %
PLATELETS: 263 10*3/uL (ref 150–450)
RBC: 4.66 x10E6/uL (ref 4.14–5.80)
RDW: 14.6 % (ref 12.3–15.4)
WBC: 6.9 10*3/uL (ref 3.4–10.8)

## 2018-03-16 LAB — CMP14+EGFR
A/G RATIO: 1.8 (ref 1.2–2.2)
ALT: 13 IU/L (ref 0–44)
AST: 14 IU/L (ref 0–40)
Albumin: 4.4 g/dL (ref 3.5–5.5)
Alkaline Phosphatase: 60 IU/L (ref 39–117)
BUN/Creatinine Ratio: 9 (ref 9–20)
BUN: 9 mg/dL (ref 6–24)
Bilirubin Total: 0.3 mg/dL (ref 0.0–1.2)
CALCIUM: 9.3 mg/dL (ref 8.7–10.2)
CO2: 19 mmol/L — AB (ref 20–29)
Chloride: 103 mmol/L (ref 96–106)
Creatinine, Ser: 0.99 mg/dL (ref 0.76–1.27)
GFR calc Af Amer: 97 mL/min/{1.73_m2} (ref >59)
GFR calc non Af Amer: 84 mL/min/{1.73_m2} (ref >59)
Globulin, Total: 2.4 g/dL (ref 1.5–4.5)
Glucose: 89 mg/dL (ref 65–99)
Potassium: 4.9 mmol/L (ref 3.5–5.2)
Sodium: 139 mmol/L (ref 134–144)
Total Protein: 6.8 g/dL (ref 6.0–8.5)

## 2018-03-16 LAB — LIPID PANEL
CHOL/HDL RATIO: 7.1 ratio — AB (ref 0.0–5.0)
Cholesterol, Total: 198 mg/dL (ref 100–199)
HDL: 28 mg/dL — AB (ref >39)
LDL Calculated: 144 mg/dL — ABNORMAL HIGH (ref 0–99)
TRIGLYCERIDES: 132 mg/dL (ref 0–149)
VLDL Cholesterol Cal: 26 mg/dL (ref 5–40)

## 2018-03-16 LAB — PSA: Prostate Specific Ag, Serum: 0.4 ng/mL (ref 0.0–4.0)

## 2018-03-16 LAB — TSH: TSH: 1.05 u[IU]/mL (ref 0.450–4.500)

## 2018-03-16 NOTE — Progress Notes (Signed)
BP 131/89   Pulse 67   Temp 98.2 F (36.8 C) (Oral)   Ht '5\' 11"'  (1.803 m)   Wt 162 lb 6.4 oz (73.7 kg)   BMI 22.65 kg/m    Subjective:    Patient ID: Jerry Gill, male    DOB: [birth year], 59 y.o.   MRN: [id number]  HPI: Jerry Gill is a 57 y.o. male presenting on 03/15/2018 for Annual Exam  This patient comes in for an annual wellness visit and evaluation for his chronic medical conditions.  They do include hypertension, degenerative disc disease, hyperlipidemia.  He states he is not having any other difficulties at this time. This patient comes in for annual well physical examination. All medications are reviewed today. There are no reports of any problems with the medications. All of the medical conditions are reviewed and updated.  Lab work is reviewed and will be ordered as medically necessary. There are no new problems reported with today's visit.  Patient reports doing well overall.   Past Medical History:  Diagnosis Date  . AAA (abdominal aortic aneurysm) ([place])   . Collagen vascular disease ([place])   . DDD (degenerative disc disease), lumbar   . Hypertension    Relevant past medical, surgical, family and social history reviewed and updated as indicated. Interim medical history since our last visit reviewed. Allergies and medications reviewed and updated. DATA REVIEWED: CHART IN EPIC  Family History reviewed for pertinent findings.  Review of Systems  Constitutional: Negative.  Negative for appetite change and fatigue.  HENT: Negative.   Eyes: Negative.  Negative for pain and visual disturbance.  Respiratory: Negative.  Negative for cough, chest tightness, shortness of breath and wheezing.   Cardiovascular: Negative.  Negative for chest pain, palpitations and leg swelling.  Gastrointestinal: Negative.  Negative for abdominal pain, diarrhea, nausea and vomiting.  Endocrine: Negative.   Genitourinary: Negative.   Musculoskeletal: Positive for arthralgias, back pain  and myalgias.  Skin: Negative.  Negative for color change and rash.  Neurological: Negative.  Negative for weakness, numbness and headaches.  Psychiatric/Behavioral: Negative.     Allergies as of 03/15/2018      Reactions   Codeine Nausea And Vomiting   Penicillins Itching, Rash   Has patient had a PCN reaction causing immediate rash, facial/tongue/throat swelling, SOB or lightheadedness with hypotension: Unknown, childhood reaction Has patient had a PCN reaction causing severe rash involving mucus membranes or skin necrosis: No Has patient had a PCN reaction that required hospitalization No Has patient had a PCN reaction occurring within the last 10 years: No If all of the above answers are "NO", then may proceed with Cephalosporin use.   Sulfa Antibiotics Itching      Medication List        Accurate as of 03/15/18 11:59 PM. Always use your most recent med list.          albuterol 108 (90 Base) MCG/ACT inhaler Commonly known as:  PROVENTIL HFA;VENTOLIN HFA Inhale 2 puffs into the lungs every 4 (four) hours as needed for wheezing or shortness of breath.   aspirin EC 81 MG tablet Take 81 mg by mouth every evening.   clobetasol 0.05 % external solution Commonly known as:  TEMOVATE Apply 1 application topically 2 (two) times daily.   fluticasone 50 MCG/ACT nasal spray Commonly known as:  FLONASE Place 1 spray into both nostrils daily as needed for allergies or rhinitis.   metoprolol succinate 100 MG 24 hr tablet Commonly known as:  TOPROL-XL TAKE 2 TABLETS BY MOUTH DAILY. TAKE 1&1/2 TABLETS MOST DAYS   MULTIVITAMIN GUMMIES MENS PO Take 1 tablet by mouth 2 (two) times daily.   ranitidine 300 MG tablet Commonly known as:  ZANTAC Take 1 tablet (300 mg total) by mouth at bedtime.   sildenafil 20 MG tablet Commonly known as:  REVATIO TAKE 2-5 TABLETS AS NEEDED PRIOR TO SEXUAL ACTIVITY   traMADol 50 MG tablet Commonly known as:  ULTRAM Take 1 tablet (50 mg total) by mouth  every 6 (six) hours as needed.          Objective:    BP 131/89   Pulse 67   Temp 98.2 F (36.8 C) (Oral)   Ht '5\' 11"'  (1.803 m)   Wt 162 lb 6.4 oz (73.7 kg)   BMI 22.65 kg/m   Allergies  Allergen Reactions  . Codeine Nausea And Vomiting  . Penicillins Itching and Rash    Has patient had a PCN reaction causing immediate rash, facial/tongue/throat swelling, SOB or lightheadedness with hypotension: Unknown, childhood reaction Has patient had a PCN reaction causing severe rash involving mucus membranes or skin necrosis: No Has patient had a PCN reaction that required hospitalization No Has patient had a PCN reaction occurring within the last 10 years: No If all of the above answers are "NO", then may proceed with Cephalosporin use.   . Sulfa Antibiotics Itching    Wt Readings from Last 3 Encounters:  03/15/18 162 lb 6.4 oz (73.7 kg)  10/07/17 161 lb (73 kg)  08/13/17 165 lb (74.8 kg)    Physical Exam  Constitutional: He appears well-developed and well-nourished.  HENT:  Head: Normocephalic and atraumatic.  Eyes: Pupils are equal, round, and reactive to light. Conjunctivae and EOM are normal.  Neck: Normal range of motion. Neck supple.  Cardiovascular: Normal rate, regular rhythm and normal heart sounds.  Pulmonary/Chest: Effort normal and breath sounds normal.  Abdominal: Soft. Bowel sounds are normal.  Musculoskeletal: Normal range of motion.  Skin: Skin is warm and dry.    Results for orders placed or performed in visit on 03/15/18  CBC with Differential/Platelet  Result Value Ref Range   WBC 6.9 3.4 - 10.8 x10E3/uL   RBC 4.66 4.14 - 5.80 x10E6/uL   Hemoglobin 14.0 13.0 - 17.7 g/dL   Hematocrit 41.0 37.5 - 51.0 %   MCV 88 79 - 97 fL   MCH 30.0 26.6 - 33.0 pg   MCHC 34.1 31.5 - 35.7 g/dL   RDW 14.6 12.3 - 15.4 %   Platelets 263 150 - 450 x10E3/uL   Neutrophils 58 Not Estab. %   Lymphs 33 Not Estab. %   Monocytes 6 Not Estab. %   Eos 3 Not Estab. %   Basos 0  Not Estab. %   Neutrophils Absolute 4.0 1.4 - 7.0 x10E3/uL   Lymphocytes Absolute 2.2 0.7 - 3.1 x10E3/uL   Monocytes Absolute 0.4 0.1 - 0.9 x10E3/uL   EOS (ABSOLUTE) 0.2 0.0 - 0.4 x10E3/uL   Basophils Absolute 0.0 0.0 - 0.2 x10E3/uL   Immature Granulocytes 0 Not Estab. %   Immature Grans (Abs) 0.0 0.0 - 0.1 x10E3/uL  CMP14+EGFR  Result Value Ref Range   Glucose 89 65 - 99 mg/dL   BUN 9 6 - 24 mg/dL   Creatinine, Ser 0.99 0.76 - 1.27 mg/dL   GFR calc non Af Amer 84 >59 mL/min/1.73   GFR calc Af Amer 97 >59 mL/min/1.73   BUN/Creatinine Ratio 9 9 -  20   Sodium 139 134 - 144 mmol/L   Potassium 4.9 3.5 - 5.2 mmol/L   Chloride 103 96 - 106 mmol/L   CO2 19 (L) 20 - 29 mmol/L   Calcium 9.3 8.7 - 10.2 mg/dL   Total Protein 6.8 6.0 - 8.5 g/dL   Albumin 4.4 3.5 - 5.5 g/dL   Globulin, Total 2.4 1.5 - 4.5 g/dL   Albumin/Globulin Ratio 1.8 1.2 - 2.2   Bilirubin Total 0.3 0.0 - 1.2 mg/dL   Alkaline Phosphatase 60 39 - 117 IU/L   AST 14 0 - 40 IU/L   ALT 13 0 - 44 IU/L  Lipid panel  Result Value Ref Range   Cholesterol, Total 198 100 - 199 mg/dL   Triglycerides 132 0 - 149 mg/dL   HDL 28 (L) >39 mg/dL   VLDL Cholesterol Cal 26 5 - 40 mg/dL   LDL Calculated 144 (H) 0 - 99 mg/dL   Chol/HDL Ratio 7.1 (H) 0.0 - 5.0 ratio  TSH  Result Value Ref Range   TSH 1.050 0.450 - 4.500 uIU/mL  PSA  Result Value Ref Range   Prostate Specific Ag, Serum 0.4 0.0 - 4.0 ng/mL      Assessment & Plan:   1. Well adult exam - CBC with Differential/Platelet - CMP14+EGFR - Lipid panel - TSH - PSA  2. Essential hypertension Continue metoprolol  3. DDD (degenerative disc disease), lumbar - traMADol (ULTRAM) 50 MG tablet; Take 1 tablet (50 mg total) by mouth every 6 (six) hours as needed.  Dispense: 120 tablet; Refill: 5  4. Dyslipidemia Continue diet and exercise               5. DDD (degenerative disc disease), cervical - traMADol (ULTRAM) 50 MG tablet; Take 1 tablet (50 mg total) by mouth every  6 (six) hours as needed.  Dispense: 120 tablet; Refill: 5   Continue all other maintenance medications as listed above.  Follow up plan: Return in about 6 months (around 09/15/2018) for recheck.  Educational handout given for [place] PA-C [facility] [address]  [place], [place] [address] [phone]   03/16/2018, 4:44 PM

## 2018-03-18 NOTE — Telephone Encounter (Signed)
Pt aware.

## 2018-03-31 ENCOUNTER — Telehealth: Payer: Self-pay

## 2018-03-31 NOTE — Telephone Encounter (Signed)
Medicaid approved Tramadol through 09/18/2018

## 2018-04-04 ENCOUNTER — Other Ambulatory Visit: Payer: Self-pay | Admitting: Physician Assistant

## 2018-04-05 NOTE — Telephone Encounter (Signed)
Last seen 03/15/18  Jerry Gill

## 2018-06-01 ENCOUNTER — Other Ambulatory Visit: Payer: Self-pay | Admitting: Physician Assistant

## 2018-06-02 NOTE — Telephone Encounter (Signed)
Last seen 03/15/18

## 2018-06-20 ENCOUNTER — Other Ambulatory Visit: Payer: Self-pay | Admitting: Physician Assistant

## 2018-07-15 ENCOUNTER — Other Ambulatory Visit: Payer: Self-pay | Admitting: Physician Assistant

## 2018-07-15 NOTE — Telephone Encounter (Signed)
Last seen 04/08/18

## 2018-09-15 ENCOUNTER — Ambulatory Visit: Payer: Medicaid Other | Admitting: Physician Assistant

## 2018-09-17 ENCOUNTER — Other Ambulatory Visit: Payer: Self-pay | Admitting: Physician Assistant

## 2018-10-03 ENCOUNTER — Other Ambulatory Visit: Payer: Self-pay | Admitting: Physician Assistant

## 2018-10-21 ENCOUNTER — Other Ambulatory Visit: Payer: Self-pay | Admitting: Physician Assistant

## 2018-10-28 ENCOUNTER — Other Ambulatory Visit: Payer: Self-pay | Admitting: Physician Assistant

## 2018-10-28 DIAGNOSIS — M5030 Other cervical disc degeneration, unspecified cervical region: Secondary | ICD-10-CM

## 2018-10-28 DIAGNOSIS — M5136 Other intervertebral disc degeneration, lumbar region: Secondary | ICD-10-CM

## 2018-11-15 ENCOUNTER — Other Ambulatory Visit: Payer: Self-pay | Admitting: Physician Assistant

## 2018-11-28 ENCOUNTER — Other Ambulatory Visit: Payer: Self-pay | Admitting: Physician Assistant

## 2018-12-16 ENCOUNTER — Other Ambulatory Visit: Payer: Self-pay | Admitting: Physician Assistant

## 2018-12-16 ENCOUNTER — Other Ambulatory Visit: Payer: Self-pay | Admitting: *Deleted

## 2018-12-16 DIAGNOSIS — I10 Essential (primary) hypertension: Secondary | ICD-10-CM

## 2018-12-16 DIAGNOSIS — M5030 Other cervical disc degeneration, unspecified cervical region: Secondary | ICD-10-CM

## 2018-12-16 DIAGNOSIS — M5136 Other intervertebral disc degeneration, lumbar region: Secondary | ICD-10-CM

## 2018-12-16 MED ORDER — METOPROLOL SUCCINATE ER 100 MG PO TB24: ORAL_TABLET | ORAL | 3 refills | Status: DC

## 2018-12-16 MED ORDER — FLUTICASONE PROPIONATE 50 MCG/ACT NA SUSP: 1.0000 | Freq: Every day | NASAL | 6 refills | Status: DC | PRN

## 2019-01-05 ENCOUNTER — Other Ambulatory Visit: Payer: Self-pay | Admitting: Physician Assistant

## 2019-01-15 DIAGNOSIS — R05 Cough: Secondary | ICD-10-CM | POA: Diagnosis not present

## 2019-01-15 DIAGNOSIS — R0989 Other specified symptoms and signs involving the circulatory and respiratory systems: Secondary | ICD-10-CM | POA: Diagnosis not present

## 2019-01-15 DIAGNOSIS — J069 Acute upper respiratory infection, unspecified: Secondary | ICD-10-CM | POA: Diagnosis not present

## 2019-01-15 DIAGNOSIS — F17210 Nicotine dependence, cigarettes, uncomplicated: Secondary | ICD-10-CM | POA: Diagnosis not present

## 2019-01-18 ENCOUNTER — Other Ambulatory Visit: Payer: Self-pay | Admitting: Physician Assistant

## 2019-01-26 ENCOUNTER — Other Ambulatory Visit: Payer: Self-pay | Admitting: Physician Assistant

## 2019-02-17 ENCOUNTER — Other Ambulatory Visit: Payer: Self-pay | Admitting: Physician Assistant

## 2019-02-27 ENCOUNTER — Other Ambulatory Visit: Payer: Self-pay

## 2019-02-27 ENCOUNTER — Encounter: Payer: Self-pay | Admitting: Physician Assistant

## 2019-02-27 ENCOUNTER — Ambulatory Visit (INDEPENDENT_AMBULATORY_CARE_PROVIDER_SITE_OTHER): Payer: Medicare HMO | Admitting: Physician Assistant

## 2019-02-27 DIAGNOSIS — Z125 Encounter for screening for malignant neoplasm of prostate: Secondary | ICD-10-CM | POA: Diagnosis not present

## 2019-02-27 DIAGNOSIS — N529 Male erectile dysfunction, unspecified: Secondary | ICD-10-CM

## 2019-02-27 DIAGNOSIS — I10 Essential (primary) hypertension: Secondary | ICD-10-CM | POA: Diagnosis not present

## 2019-02-27 DIAGNOSIS — M5030 Other cervical disc degeneration, unspecified cervical region: Secondary | ICD-10-CM | POA: Diagnosis not present

## 2019-02-27 DIAGNOSIS — M5136 Other intervertebral disc degeneration, lumbar region: Secondary | ICD-10-CM

## 2019-02-27 DIAGNOSIS — E785 Hyperlipidemia, unspecified: Secondary | ICD-10-CM

## 2019-02-27 DIAGNOSIS — Z9889 Other specified postprocedural states: Secondary | ICD-10-CM

## 2019-02-27 MED ORDER — ALBUTEROL SULFATE HFA 108 (90 BASE) MCG/ACT IN AERS: 2.0000 | INHALATION_SPRAY | RESPIRATORY_TRACT | 0 refills | Status: DC | PRN

## 2019-02-27 MED ORDER — SILDENAFIL CITRATE 20 MG PO TABS: ORAL_TABLET | ORAL | 3 refills | Status: DC

## 2019-02-27 MED ORDER — OMEPRAZOLE 20 MG PO CPDR: 20.0000 mg | DELAYED_RELEASE_CAPSULE | Freq: Every day | ORAL | 11 refills | Status: DC

## 2019-02-27 MED ORDER — TRAMADOL HCL 50 MG PO TABS: 50.0000 mg | ORAL_TABLET | Freq: Four times a day (QID) | ORAL | 1 refills | Status: DC | PRN

## 2019-02-27 NOTE — Progress Notes (Signed)
Telephone visit  Subjective: CC: Check on chronic conditions and medication refills PCP: Terald Sleeper, PA-C HUD:[id number] Jerry Gill is a 58 y.o. male calls for telephone consult today. Patient provides verbal consent for consult held via phone.  Patient is identified with 2 separate identifiers.  At this time the entire area is on COVID-19 social distancing and stay home orders are in place.  Patient is of higher risk and therefore we are performing this by a virtual method.  Location of patient: Home Location of provider: HOME Others present for call: No  Patient needs recheck on his chronic conditions and medication refill.  He states overall he is doing well.  His blood pressures been well controlled.  He also has had very good control of his chronic pain related to degenerative disc disease.  He does need to have labs performed this summer to check for his hyperlipidemia.  He has a past history of AAA repair.  He also needs well labs to check for prostate cancer and he does need a refill on his sildenafil.  It is doing well for the erectile dysfunction.  He states overall at this time he is doing well very well and not having any issues.   ROS: Per HPI  Allergies  Allergen Reactions  . Codeine Nausea And Vomiting  . Penicillins Itching and Rash    Has patient had a PCN reaction causing immediate rash, facial/tongue/throat swelling, SOB or lightheadedness with hypotension: Unknown, childhood reaction Has patient had a PCN reaction causing severe rash involving mucus membranes or skin necrosis: No Has patient had a PCN reaction that required hospitalization No Has patient had a PCN reaction occurring within the last 10 years: No If all of the above answers are "NO", then may proceed with Cephalosporin use.   . Sulfa Antibiotics Itching   Past Medical History:  Diagnosis Date  . AAA (abdominal aortic aneurysm) ([place])   . Collagen vascular disease ([place])   . DDD (degenerative disc  disease), lumbar   . Hypertension     Current Outpatient Medications:  .  albuterol (VENTOLIN HFA) 108 (90 Base) MCG/ACT inhaler, Inhale 2 puffs into the lungs every 4 (four) hours as needed for wheezing or shortness of breath., Disp: 1 Inhaler, Rfl: 0 .  aspirin EC 81 MG tablet, Take 81 mg by mouth every evening., Disp: , Rfl:  .  clobetasol (TEMOVATE) 0.05 % external solution, Apply 1 application topically 2 (two) times daily., Disp: 50 mL, Rfl: 11 .  fluticasone (FLONASE) 50 MCG/ACT nasal spray, Place 1 spray into both nostrils daily as needed for allergies or rhinitis., Disp: 16 g, Rfl: 6 .  metoprolol succinate (TOPROL-XL) 100 MG 24 hr tablet, TAKE 2 TABLETS BY MOUTH DAILY. TAKE 1&1/2 TABLETS MOST DAYS, Disp: 180 tablet, Rfl: 3 .  Multiple Vitamins-Minerals (MULTIVITAMIN GUMMIES MENS PO), Take 1 tablet by mouth 2 (two) times daily., Disp: , Rfl:  .  omeprazole (PRILOSEC) 20 MG capsule, Take 1 capsule (20 mg total) by mouth daily. For reflux, Disp: 30 capsule, Rfl: 11 .  sildenafil (REVATIO) 20 MG tablet, TAKE 2-5 TABLETS AS NEEDED PRIOR TO SEXUAL ACTIVITY, Disp: 90 tablet, Rfl: 3 .  traMADol (ULTRAM) 50 MG tablet, Take 1 tablet (50 mg total) by mouth every 6 (six) hours as needed., Disp: 120 tablet, Rfl: 1  Assessment/ Plan: 58 y.o. male   1. DDD (degenerative disc disease), cervical - traMADol (ULTRAM) 50 MG tablet; Take 1 tablet (50 mg total) by mouth  every 6 (six) hours as needed.  Dispense: 120 tablet; Refill: 1  2. DDD (degenerative disc disease), lumbar - traMADol (ULTRAM) 50 MG tablet; Take 1 tablet (50 mg total) by mouth every 6 (six) hours as needed.  Dispense: 120 tablet; Refill: 1  3. Essential hypertension - CBC with Differential/Platelet; Future - CMP14+EGFR; Future - Lipid panel; Future  4. Dyslipidemia - CBC with Differential/Platelet; Future - CMP14+EGFR; Future - Lipid panel; Future  5. History of AAA (abdominal aortic aneurysm) repair - CBC with  Differential/Platelet; Future - CMP14+EGFR; Future - Lipid panel; Future  6. Screening for prostate cancer - PSA; Future  7. Erectile dysfunction, unspecified erectile dysfunction type - sildenafil (REVATIO) 20 MG tablet; TAKE 2-5 TABLETS AS NEEDED PRIOR TO SEXUAL ACTIVITY  Dispense: 90 tablet; Refill: 3   Continue all other maintenance medications as listed above.  Start time: 10:23 AM End time: 10:29 AM  Meds ordered this encounter  Medications  . omeprazole (PRILOSEC) 20 MG capsule    Sig: Take 1 capsule (20 mg total) by mouth daily. For reflux    Dispense:  30 capsule    Refill:  11    Order Specific Question:   Supervising Provider    Answer:   Janora Norlander [[id number]]  . albuterol (VENTOLIN HFA) 108 (90 Base) MCG/ACT inhaler    Sig: Inhale 2 puffs into the lungs every 4 (four) hours as needed for wheezing or shortness of breath.    Dispense:  1 Inhaler    Refill:  0    Order Specific Question:   Supervising Provider    Answer:   Janora Norlander [[id number]]  . traMADol (ULTRAM) 50 MG tablet    Sig: Take 1 tablet (50 mg total) by mouth every 6 (six) hours as needed.    Dispense:  120 tablet    Refill:  1    Order Specific Question:   Supervising Provider    Answer:   Janora Norlander [[id number]]  . sildenafil (REVATIO) 20 MG tablet    Sig: TAKE 2-5 TABLETS AS NEEDED PRIOR TO SEXUAL ACTIVITY    Dispense:  90 tablet    Refill:  3    Order Specific Question:   Supervising Provider    Answer:   Janora Norlander [[id number]]    Particia Nearing PA-C [place] [phone]

## 2019-03-20 ENCOUNTER — Other Ambulatory Visit: Payer: Medicare HMO

## 2019-03-20 ENCOUNTER — Other Ambulatory Visit: Payer: Self-pay

## 2019-03-20 DIAGNOSIS — Z9889 Other specified postprocedural states: Secondary | ICD-10-CM | POA: Diagnosis not present

## 2019-03-20 DIAGNOSIS — Z125 Encounter for screening for malignant neoplasm of prostate: Secondary | ICD-10-CM | POA: Diagnosis not present

## 2019-03-20 DIAGNOSIS — I10 Essential (primary) hypertension: Secondary | ICD-10-CM | POA: Diagnosis not present

## 2019-03-20 DIAGNOSIS — E785 Hyperlipidemia, unspecified: Secondary | ICD-10-CM | POA: Diagnosis not present

## 2019-03-21 LAB — CBC WITH DIFFERENTIAL/PLATELET
Basophils Absolute: 0 10*3/uL (ref 0.0–0.2)
Basos: 0 %
EOS (ABSOLUTE): 0.2 10*3/uL (ref 0.0–0.4)
Eos: 3 %
Hematocrit: 43.4 % (ref 37.5–51.0)
Hemoglobin: 14.7 g/dL (ref 13.0–17.7)
Immature Grans (Abs): 0 10*3/uL (ref 0.0–0.1)
Immature Granulocytes: 0 %
Lymphocytes Absolute: 2.1 10*3/uL (ref 0.7–3.1)
Lymphs: 26 %
MCH: 30 pg (ref 26.6–33.0)
MCHC: 33.9 g/dL (ref 31.5–35.7)
MCV: 89 fL (ref 79–97)
Monocytes Absolute: 0.5 10*3/uL (ref 0.1–0.9)
Monocytes: 6 %
Neutrophils Absolute: 5.4 10*3/uL (ref 1.4–7.0)
Neutrophils: 65 %
Platelets: 252 10*3/uL (ref 150–450)
RBC: 4.9 x10E6/uL (ref 4.14–5.80)
RDW: 13.9 % (ref 11.6–15.4)
WBC: 8.3 10*3/uL (ref 3.4–10.8)

## 2019-03-21 LAB — LIPID PANEL
Chol/HDL Ratio: 6.6 ratio — ABNORMAL HIGH (ref 0.0–5.0)
Cholesterol, Total: 177 mg/dL (ref 100–199)
HDL: 27 mg/dL — ABNORMAL LOW (ref >39)
LDL Calculated: 122 mg/dL — ABNORMAL HIGH (ref 0–99)
Triglycerides: 141 mg/dL (ref 0–149)
VLDL Cholesterol Cal: 28 mg/dL (ref 5–40)

## 2019-03-21 LAB — CMP14+EGFR
ALT: 12 IU/L (ref 0–44)
AST: 12 IU/L (ref 0–40)
Albumin/Globulin Ratio: 1.7 (ref 1.2–2.2)
Albumin: 4.1 g/dL (ref 3.8–4.9)
Alkaline Phosphatase: 66 IU/L (ref 39–117)
BUN/Creatinine Ratio: 8 — ABNORMAL LOW (ref 9–20)
BUN: 7 mg/dL (ref 6–24)
Bilirubin Total: <0.2 mg/dL (ref 0.0–1.2)
CO2: 23 mmol/L (ref 20–29)
Calcium: 9.1 mg/dL (ref 8.7–10.2)
Chloride: 103 mmol/L (ref 96–106)
Creatinine, Ser: 0.86 mg/dL (ref 0.76–1.27)
GFR calc Af Amer: 110 mL/min/{1.73_m2} (ref >59)
GFR calc non Af Amer: 95 mL/min/{1.73_m2} (ref >59)
Globulin, Total: 2.4 g/dL (ref 1.5–4.5)
Glucose: 125 mg/dL — ABNORMAL HIGH (ref 65–99)
Potassium: 4.3 mmol/L (ref 3.5–5.2)
Sodium: 139 mmol/L (ref 134–144)
Total Protein: 6.5 g/dL (ref 6.0–8.5)

## 2019-03-21 LAB — PSA: Prostate Specific Ag, Serum: 0.4 ng/mL (ref 0.0–4.0)

## 2019-05-04 ENCOUNTER — Other Ambulatory Visit: Payer: Self-pay | Admitting: Physician Assistant

## 2019-05-04 DIAGNOSIS — N529 Male erectile dysfunction, unspecified: Secondary | ICD-10-CM

## 2019-07-24 ENCOUNTER — Ambulatory Visit (INDEPENDENT_AMBULATORY_CARE_PROVIDER_SITE_OTHER): Payer: Medicare HMO | Admitting: Physician Assistant

## 2019-07-24 ENCOUNTER — Encounter: Payer: Self-pay | Admitting: Physician Assistant

## 2019-07-24 DIAGNOSIS — J0110 Acute frontal sinusitis, unspecified: Secondary | ICD-10-CM | POA: Diagnosis not present

## 2019-07-24 MED ORDER — AZITHROMYCIN 250 MG PO TABS: ORAL_TABLET | ORAL | 0 refills | Status: DC

## 2019-07-24 NOTE — Progress Notes (Signed)
Telephone visit  Subjective: HQ:PRFFM PCP: Jerry Loffler, PA-C [id number] [name] is a 58 y.o. male calls for telephone consult today. Patient provides verbal consent for consult held via phone.  Patient is identified with 2 separate identifiers.  At this time the entire area is on COVID-19 social distancing and stay home orders are in place.  Patient is of higher risk and therefore we are performing this by a virtual method.  Location of patient: home Location of provider: [facility] Others present for call: no  This patient has had many days of sinus headache and postnasal drainage. There is copious drainage at times. Denies any fever at this time. There has been a history of sinus infections in the past.  No history of sinus surgery. There is cough at night. It has become more prevalent in recent days.    ROS: Per HPI  Allergies  Allergen Reactions  . Codeine Nausea And Vomiting  . Penicillins Itching and Rash    Has patient had a PCN reaction causing immediate rash, facial/tongue/throat swelling, SOB or lightheadedness with hypotension: Unknown, childhood reaction Has patient had a PCN reaction causing severe rash involving mucus membranes or skin necrosis: No Has patient had a PCN reaction that required hospitalization No Has patient had a PCN reaction occurring within the last 10 years: No If all of the above answers are "NO", then may proceed with Cephalosporin use.   . Sulfa Antibiotics Itching   Past Medical History:  Diagnosis Date  . AAA (abdominal aortic aneurysm) ([facility])   . Collagen vascular disease ([facility])   . DDD (degenerative disc disease), lumbar   . Hypertension     Current Outpatient Medications:  .  albuterol (VENTOLIN HFA) 108 (90 Base) MCG/ACT inhaler, Inhale 2 puffs into the lungs every 4 (four) hours as needed for wheezing or shortness of breath., Disp: 1 Inhaler, Rfl: 0 .  aspirin EC 81 MG tablet, Take 81 mg by mouth every evening., Disp: , Rfl:  .   azithromycin (ZITHROMAX Z-PAK) 250 MG tablet, Take as directed, Disp: 6 each, Rfl: 0 .  clobetasol (TEMOVATE) 0.05 % external solution, Apply 1 application topically 2 (two) times daily., Disp: 50 mL, Rfl: 11 .  fluticasone (FLONASE) 50 MCG/ACT nasal spray, Place 1 spray into both nostrils daily as needed for allergies or rhinitis., Disp: 16 g, Rfl: 6 .  metoprolol succinate (TOPROL-XL) 100 MG 24 hr tablet, TAKE 2 TABLETS BY MOUTH DAILY. TAKE 1&1/2 TABLETS MOST DAYS, Disp: 180 tablet, Rfl: 3 .  Multiple Vitamins-Minerals (MULTIVITAMIN GUMMIES MENS PO), Take 1 tablet by mouth 2 (two) times daily., Disp: , Rfl:  .  omeprazole (PRILOSEC) 20 MG capsule, Take 1 capsule (20 mg total) by mouth daily. For reflux, Disp: 30 capsule, Rfl: 11 .  sildenafil (REVATIO) 20 MG tablet, TAKE 2-5 TABLETS AS NEEDED PRIOR TO SEXUAL ACTIVITY, Disp: 90 tablet, Rfl: 3 .  traMADol (ULTRAM) 50 MG tablet, Take 1 tablet (50 mg total) by mouth every 6 (six) hours as needed., Disp: 120 tablet, Rfl: 1  Assessment/ Plan: 58 y.o. male   1. Acute non-recurrent frontal sinusitis - azithromycin (ZITHROMAX Z-PAK) 250 MG tablet; Take as directed  Dispense: 6 each; Refill: 0   No follow-ups on file.  Continue all other maintenance medications as listed above.  Start time: 11:28 AM End time: 11:36 AM  Meds ordered this encounter  Medications  . azithromycin (ZITHROMAX Z-PAK) 250 MG tablet    Sig: Take as directed  Dispense:  6 each    Refill:  0    Order Specific Question:   Supervising Provider    Answer:   Jerry Gill [[id number]]    Jerry Nearing PA-C [place] [phone]

## 2019-08-18 ENCOUNTER — Other Ambulatory Visit: Payer: Self-pay | Admitting: Physician Assistant

## 2019-08-18 DIAGNOSIS — N529 Male erectile dysfunction, unspecified: Secondary | ICD-10-CM

## 2019-11-01 ENCOUNTER — Other Ambulatory Visit: Payer: Self-pay | Admitting: Physician Assistant

## 2019-11-01 ENCOUNTER — Telehealth: Payer: Self-pay | Admitting: Physician Assistant

## 2019-11-01 NOTE — Chronic Care Management (AMB) (Signed)
  Chronic Care Management   Outreach Note  11/01/2019 Name: Saajan Willmon MRN: [id number] DOB: [birth year]  Emrys Mckamie is a 59 y.o. year old male who is a primary care patient of Remus Loffler, PA-C. I reached out to Scharlene Corn by phone today in response to a referral sent by Mr. [facility] health plan.     An unsuccessful telephone outreach was attempted today. The patient was referred to the case management team for assistance with care management and care coordination.   Follow Up Plan: A HIPPA compliant phone message was left for the patient providing contact information and requesting a return call.  The care management team will reach out to the patient again over the next 7 days.  If patient returns call to provider office, please advise to call Embedded Care Management Care Guide Penne Lash  at [phone]  Penne Lash, RMA Care Guide, Embedded Care Coordination [facility]  [place], [place] [address] Direct Dial: [phone] [email or web address] Website: [email or web address]

## 2019-11-06 NOTE — Chronic Care Management (AMB) (Signed)
Chronic Care Management   Note  11/06/2019 Name: [name] MRN: [id number] DOB: October 15, 1960  Jerry Gill is a 59 y.o. year old male who is a primary care patient of Theodoro Clock. I reached out to [name] by phone today in response to a referral sent by Mr. [facility] health plan.     Mr. [place] was given information about Chronic Care Management services today including:  1. CCM service includes personalized support from designated clinical staff supervised by his physician, including individualized plan of care and coordination with other care providers 2. 24/7 contact phone numbers for assistance for urgent and routine care needs. 3. Service will only be billed when office clinical staff spend 20 minutes or more in a month to coordinate care. 4. Only one practitioner may furnish and bill the service in a calendar month. 5. The patient may stop CCM services at any time (effective at the end of the month) by phone call to the office staff. 6. The patient will be responsible for cost sharing (co-pay) of up to 20% of the service fee (after annual deductible is met).  Patient did not agree to enrollment in care management services and does not wish to consider at this time.  Follow up plan: The patient has been provided with contact information for the care management team and has been advised to call with any health related questions or concerns.   Noreene Larsson, [address], [place], [place] [address] Direct Dial: [phone] [email or web address] Website: [email or web address]

## 2019-11-28 ENCOUNTER — Other Ambulatory Visit: Payer: Self-pay | Admitting: Physician Assistant

## 2019-11-28 DIAGNOSIS — N529 Male erectile dysfunction, unspecified: Secondary | ICD-10-CM

## 2019-12-27 DIAGNOSIS — Z0100 Encounter for examination of eyes and vision without abnormal findings: Secondary | ICD-10-CM | POA: Diagnosis not present

## 2019-12-27 DIAGNOSIS — H5200 Hypermetropia, unspecified eye: Secondary | ICD-10-CM | POA: Diagnosis not present

## 2019-12-27 DIAGNOSIS — H2513 Age-related nuclear cataract, bilateral: Secondary | ICD-10-CM | POA: Diagnosis not present

## 2020-01-16 ENCOUNTER — Other Ambulatory Visit: Payer: Self-pay | Admitting: *Deleted

## 2020-01-16 DIAGNOSIS — I10 Essential (primary) hypertension: Secondary | ICD-10-CM

## 2020-01-16 MED ORDER — METOPROLOL SUCCINATE ER 100 MG PO TB24: ORAL_TABLET | ORAL | 0 refills | Status: DC

## 2020-01-16 MED ORDER — FLUTICASONE PROPIONATE 50 MCG/ACT NA SUSP: 1.0000 | Freq: Every day | NASAL | 0 refills | Status: DC | PRN

## 2020-03-28 ENCOUNTER — Other Ambulatory Visit: Payer: Self-pay | Admitting: *Deleted

## 2020-03-28 DIAGNOSIS — N529 Male erectile dysfunction, unspecified: Secondary | ICD-10-CM

## 2020-03-31 MED ORDER — SILDENAFIL CITRATE 20 MG PO TABS: ORAL_TABLET | ORAL | 3 refills | Status: DC

## 2020-04-01 ENCOUNTER — Other Ambulatory Visit: Payer: Self-pay | Admitting: Family Medicine

## 2020-04-01 DIAGNOSIS — N529 Male erectile dysfunction, unspecified: Secondary | ICD-10-CM

## 2020-04-01 MED ORDER — SILDENAFIL CITRATE 20 MG PO TABS: ORAL_TABLET | ORAL | 3 refills | Status: DC

## 2020-04-29 ENCOUNTER — Other Ambulatory Visit: Payer: Self-pay

## 2020-04-29 ENCOUNTER — Ambulatory Visit (INDEPENDENT_AMBULATORY_CARE_PROVIDER_SITE_OTHER): Payer: Medicare HMO | Admitting: Nurse Practitioner

## 2020-04-29 ENCOUNTER — Encounter: Payer: Self-pay | Admitting: Nurse Practitioner

## 2020-04-29 VITALS — BP 136/89 | HR 61 | Temp 98.1°F | Ht 71.0 in | Wt 176.6 lb

## 2020-04-29 DIAGNOSIS — M5030 Other cervical disc degeneration, unspecified cervical region: Secondary | ICD-10-CM

## 2020-04-29 DIAGNOSIS — E785 Hyperlipidemia, unspecified: Secondary | ICD-10-CM

## 2020-04-29 DIAGNOSIS — Z720 Tobacco use: Secondary | ICD-10-CM | POA: Diagnosis not present

## 2020-04-29 DIAGNOSIS — M5136 Other intervertebral disc degeneration, lumbar region: Secondary | ICD-10-CM

## 2020-04-29 DIAGNOSIS — M51369 Other intervertebral disc degeneration, lumbar region without mention of lumbar back pain or lower extremity pain: Secondary | ICD-10-CM

## 2020-04-29 DIAGNOSIS — I10 Essential (primary) hypertension: Secondary | ICD-10-CM | POA: Diagnosis not present

## 2020-04-29 DIAGNOSIS — N529 Male erectile dysfunction, unspecified: Secondary | ICD-10-CM | POA: Diagnosis not present

## 2020-04-29 MED ORDER — METOPROLOL SUCCINATE ER 100 MG PO TB24: ORAL_TABLET | ORAL | 0 refills | Status: DC

## 2020-04-29 MED ORDER — OMEPRAZOLE 20 MG PO CPDR: 20.0000 mg | DELAYED_RELEASE_CAPSULE | Freq: Every day | ORAL | 11 refills | Status: DC

## 2020-04-29 MED ORDER — SILDENAFIL CITRATE 20 MG PO TABS: ORAL_TABLET | ORAL | 3 refills | Status: DC

## 2020-04-29 MED ORDER — FLUTICASONE PROPIONATE 50 MCG/ACT NA SUSP: 1.0000 | Freq: Every day | NASAL | 0 refills | Status: DC | PRN

## 2020-04-29 MED ORDER — ALBUTEROL SULFATE HFA 108 (90 BASE) MCG/ACT IN AERS: 2.0000 | INHALATION_SPRAY | RESPIRATORY_TRACT | 1 refills | Status: DC | PRN

## 2020-04-29 MED ORDER — TRAMADOL HCL 50 MG PO TABS: 50.0000 mg | ORAL_TABLET | Freq: Four times a day (QID) | ORAL | 0 refills | Status: DC | PRN

## 2020-04-29 MED ORDER — CLOBETASOL PROPIONATE 0.05 % EX SOLN: 1.0000 "application " | Freq: Two times a day (BID) | CUTANEOUS | 11 refills | Status: AC

## 2020-04-29 NOTE — Progress Notes (Signed)
Established Patient Office Visit  Subjective:  Patient ID: Jerry Gill, male    DOB: 1961-01-31  Age: 59 y.o. MRN: [id number]  CC:  Chief Complaint  Patient presents with  . Hypertension  . dyslipidemia  . Medication Refill    Tramadol     HPI Jerry Gill  presents for follow up of Hpertension. Patient was diagnosed in _4_/2/2017__. The patient is tolerating the medication well without side effects. Compliance with treatment has been good; including taking medication as directed , maintains a healthy die and following up as directed.  Current medication Metroprolol 100 mg 2 tablet by mouth daily.  Mixed hyperlipidemia   Pt presents with hyperlipidemia. Patient was diagnosed in 12/15/2015. Compliance with treatment has been good; The patient maintains a low cholesterol diet , follows up as directed , and maintains very mild exercise regimen due to back pain. The patient denies experiencing any hypercholesterolemia related symptoms.    Past Medical History:  Diagnosis Date  . AAA (abdominal aortic aneurysm) (HCC)   . Collagen vascular disease (HCC)   . DDD (degenerative disc disease), lumbar   . Hypertension     Past Surgical History:  Procedure Laterality Date  . ABDOMINAL AORTIC ENDOVASCULAR STENT GRAFT N/A 12/20/2015   Procedure: ABDOMINAL AORTIC ENDOVASCULAR STENT GRAFT;  Surgeon: Larina Earthly, MD;  Location: [facility] OR;  Service: Vascular;  Laterality: N/A;  . hemmrroidectomy    . UMBILICAL HERNIA REPAIR     as infant      Family History  Problem Relation Age of Onset  . Heart disease Father        before age 69  . AAA (abdominal aortic aneurysm) Father   . Cancer Father   . Heart disease Mother        before age 36  . Hypertension Mother   . Hyperlipidemia Brother     Social History   Socioeconomic History  . Marital status: Married    Spouse name: Not on file  . Number of children: 7  . Years of education: 8th  . Highest education level: Not on file    Occupational History  . Occupation: truck Hospital doctor  Tobacco Use  . Smoking status: Current Some Day Smoker    Packs/day: 0.50    Years: 47.00    Pack years: 23.50    Types: Cigarettes  . Smokeless tobacco: Never Used  Vaping Use  . Vaping Use: Never used  Substance and Sexual Activity  . Alcohol use: No    Alcohol/week: 0.0 standard drinks  . Drug use: No  . Sexual activity: Not on file  Other Topics Concern  . Not on file  Social History Narrative   epworth sleepiness scale - 9 (12/13/2015)   Social Determinants of Health   Financial Resource Strain:   . Difficulty of Paying Living Expenses:   Food Insecurity:   . Worried About Programme researcher, broadcasting/film/video in the Last Year:   . Barista in the Last Year:   Transportation Needs:   . Freight forwarder (Medical):   Marland Kitchen Lack of Transportation (Non-Medical):   Physical Activity:   . Days of Exercise per Week:   . Minutes of Exercise per Session:   Stress:   . Feeling of Stress :   Social Connections:   . Frequency of Communication with Friends and Family:   . Frequency of Social Gatherings with Friends and Family:   . Attends Religious Services:   . Active Member  of Clubs or Organizations:   . Attends BankerClub or Organization Meetings:   Marland Kitchen. Marital Status:   Intimate Partner Violence:   . Fear of Current or Ex-Partner:   . Emotionally Abused:   Marland Kitchen. Physically Abused:   . Sexually Abused:     Outpatient Medications Prior to Visit  Medication Sig Dispense Refill  . albuterol (VENTOLIN HFA) 108 (90 Base) MCG/ACT inhaler INHALE 2 PUFFS EVERY 4 HOURS AS NEEDED 18 g 1  . aspirin EC 81 MG tablet Take 81 mg by mouth every evening.    Marland Kitchen. azithromycin (ZITHROMAX Z-PAK) 250 MG tablet Take as directed 6 each 0  . clobetasol (TEMOVATE) 0.05 % external solution Apply 1 application topically 2 (two) times daily. 50 mL 11  . fluticasone (FLONASE) 50 MCG/ACT nasal spray Place 1 spray into both nostrils daily as needed for allergies or  rhinitis. (Needs to be seen before next refill) 16 g 0  . metoprolol succinate (TOPROL-XL) 100 MG 24 hr tablet TAKE 2 TABLETS BY MOUTH DAILY. TAKE 1&1/2 TABLETS MOST DAYS (Needs to be seen before next refill) 60 tablet 0  . Multiple Vitamins-Minerals (MULTIVITAMIN GUMMIES MENS PO) Take 1 tablet by mouth 2 (two) times daily.    Marland Kitchen. omeprazole (PRILOSEC) 20 MG capsule Take 1 capsule (20 mg total) by mouth daily. For reflux 30 capsule 11  . sildenafil (REVATIO) 20 MG tablet TAKE 2-5 TABLETS AS NEEDED PRIOR TO SEXUAL ACTIVITY 90 tablet 3  . traMADol (ULTRAM) 50 MG tablet Take 1 tablet (50 mg total) by mouth every 6 (six) hours as needed. 120 tablet 1   No facility-administered medications prior to visit.    Allergies  Allergen Reactions  . Codeine Nausea And Vomiting  . Penicillins Itching and Rash    Has patient had a PCN reaction causing immediate rash, facial/tongue/throat swelling, SOB or lightheadedness with hypotension: Unknown, childhood reaction Has patient had a PCN reaction causing severe rash involving mucus membranes or skin necrosis: No Has patient had a PCN reaction that required hospitalization No Has patient had a PCN reaction occurring within the last 10 years: No If all of the above answers are "NO", then may proceed with Cephalosporin use.   . Sulfa Antibiotics Itching    ROS Review of Systems  Constitutional: Negative.   HENT: Negative.   Eyes: Negative.   Respiratory: Negative.   Cardiovascular: Negative.   Gastrointestinal: Negative.   Genitourinary: Negative.   Musculoskeletal: Positive for back pain.  Skin: Negative.       Objective:    Physical Exam Constitutional:      Appearance: Normal appearance.  HENT:     Head: Normocephalic.  Eyes:     Conjunctiva/sclera: Conjunctivae normal.  Cardiovascular:     Rate and Rhythm: Normal rate and regular rhythm.     Pulses: Normal pulses.     Heart sounds: Normal heart sounds.  Pulmonary:     Effort:  Pulmonary effort is normal.     Breath sounds: Normal breath sounds.  Abdominal:     General: Bowel sounds are normal.  Musculoskeletal:        General: Tenderness present.  Neurological:     Mental Status: He is alert and oriented to person, place, and time.     BP 136/89   Pulse 61   Temp 98.1 F (36.7 C) (Temporal)   Ht 5\' 11"  (1.803 m)   Wt 176 lb 9.6 oz (80.1 kg)   SpO2 98%   BMI 24.63 kg/m  Wt Readings from Last 3 Encounters:  04/29/20 176 lb 9.6 oz (80.1 kg)  03/15/18 162 lb 6.4 oz (73.7 kg)  10/07/17 161 lb (73 kg)     Health Maintenance Due  Topic Date Due  . Hepatitis C Screening  Never done  . COVID-19 Vaccine (1) Never done  . HIV Screening  Never done  . COLONOSCOPY  Never done  . INFLUENZA VACCINE  04/14/2020    There are no preventive care reminders to display for this patient.  Lab Results  Component Value Date   TSH 1.050 03/15/2018   Lab Results  Component Value Date   WBC 8.3 03/20/2019   HGB 14.7 03/20/2019   HCT 43.4 03/20/2019   MCV 89 03/20/2019   PLT 252 03/20/2019   Lab Results  Component Value Date   NA 139 03/20/2019   K 4.3 03/20/2019   CO2 23 03/20/2019   GLUCOSE 125 (H) 03/20/2019   BUN 7 03/20/2019   CREATININE 0.86 03/20/2019   BILITOT <0.2 03/20/2019   ALKPHOS 66 03/20/2019   AST 12 03/20/2019   ALT 12 03/20/2019   PROT 6.5 03/20/2019   ALBUMIN 4.1 03/20/2019   CALCIUM 9.1 03/20/2019   ANIONGAP 9 07/25/2017   Lab Results  Component Value Date   CHOL 177 03/20/2019   Lab Results  Component Value Date   HDL 27 (L) 03/20/2019   Lab Results  Component Value Date   LDLCALC 122 (H) 03/20/2019   Lab Results  Component Value Date   TRIG 141 03/20/2019   Lab Results  Component Value Date   CHOLHDL 6.6 (H) 03/20/2019   No results found for: HGBA1C    Assessment & Plan:   Problem List Items Addressed This Visit      Cardiovascular and Mediastinum   Essential hypertension    Patient presents for  follow-up of hypertension today.  Patient was diagnosed in 12/15/2015.  Patient is tolerating the medication well without side effects.  Compliance with treatment has been good, including taking medication as directed.  Maintains a healthy diet and follows up as directed.  Current medication metoprolol 100 mg 2 tablets by mouth daily.  No changes to current dose.  Provided education to patient which printed handouts given. Refill sent to pharmacy.      Relevant Medications   sildenafil (REVATIO) 20 MG tablet   metoprolol succinate (TOPROL-XL) 100 MG 24 hr tablet   Other Relevant Orders   CBC with Differential   Comprehensive metabolic panel   TSH     Musculoskeletal and Integument   DDD (degenerative disc disease), cervical    Patient's pain is currently well managed with current medication dose.  No changes necessary.  Refill sent to pharmacy.      Relevant Medications   traMADol (ULTRAM) 50 MG tablet   DDD (degenerative disc disease), lumbar   Relevant Medications   traMADol (ULTRAM) 50 MG tablet     Other   Dyslipidemia - Primary    Patient is a 59 year old male who presents to clinic for follow-up hyperlipidemia.  Patient was diagnosed in 12/15/2015.  Compliance with treatment has been good.  Patient maintains a low cholesterol diet, follows up as directed.  And maintains an exercise regimen when he can because of his lower back pain.  Patient denies experiencing any hypercholesterolemia related symptoms. Provided education to patient with printed handouts given. Labs completed today CBC, CMP, lipid panel.  Refill medication sent to pharmacy.      Relevant  Orders   Lipid Panel   Tobacco abuse    Discussed tobacco cessation with patient, at this time patient is not ready to quit and claims to have smoked for over 50 years.       Other Visit Diagnoses    Erectile dysfunction, unspecified erectile dysfunction type       Relevant Medications   sildenafil (REVATIO) 20 MG tablet        Meds ordered this encounter  Medications  . traMADol (ULTRAM) 50 MG tablet    Sig: Take 1 tablet (50 mg total) by mouth every 6 (six) hours as needed.    Dispense:  120 tablet    Refill:  0  . sildenafil (REVATIO) 20 MG tablet    Sig: TAKE 2-5 TABLETS AS NEEDED PRIOR TO SEXUAL ACTIVITY    Dispense:  90 tablet    Refill:  3  . omeprazole (PRILOSEC) 20 MG capsule    Sig: Take 1 capsule (20 mg total) by mouth daily. For reflux    Dispense:  30 capsule    Refill:  11  . fluticasone (FLONASE) 50 MCG/ACT nasal spray    Sig: Place 1 spray into both nostrils daily as needed for allergies or rhinitis. (Needs to be seen before next refill)    Dispense:  16 g    Refill:  0  . clobetasol (TEMOVATE) 0.05 % external solution    Sig: Apply 1 application topically 2 (two) times daily.    Dispense:  50 mL    Refill:  11  . albuterol (VENTOLIN HFA) 108 (90 Base) MCG/ACT inhaler    Sig: Inhale 2 puffs into the lungs every 4 (four) hours as needed.    Dispense:  18 g    Refill:  1  . metoprolol succinate (TOPROL-XL) 100 MG 24 hr tablet    Sig: TAKE 2 TABLETS BY MOUTH DAILY. TAKE 1&1/2 TABLETS MOST DAYS (Needs to be seen before next refill)    Dispense:  60 tablet    Refill:  0    Follow-up: Return in about 6 months (around 10/30/2020).    Daryll Drown, NP

## 2020-04-29 NOTE — Assessment & Plan Note (Signed)
Patient's pain is currently well managed with current medication dose.  No changes necessary.  Refill sent to pharmacy.

## 2020-04-29 NOTE — Assessment & Plan Note (Signed)
Discussed tobacco cessation with patient, at this time patient is not ready to quit and claims to have smoked for over 50 years.

## 2020-04-29 NOTE — Patient Instructions (Addendum)
Fat and Cholesterol Restricted Eating Plan °Getting too much fat and cholesterol in your diet may cause health problems. Choosing the right foods helps keep your fat and cholesterol at normal levels. This can keep you from getting certain diseases. °Your doctor may recommend an eating plan that includes: °· Total fat: ______% or less of total calories a day. °· Saturated fat: ______% or less of total calories a day. °· Cholesterol: less than _________mg a day. °· Fiber: ______g a day. °What are tips for following this plan? °Meal planning °· At meals, divide your plate into four equal parts: °? Fill one-half of your plate with vegetables and green salads. °? Fill one-fourth of your plate with whole grains. °? Fill one-fourth of your plate with low-fat (lean) protein foods. °· Eat fish that is high in omega-3 fats at least two times a week. This includes mackerel, tuna, sardines, and salmon. °· Eat foods that are high in fiber, such as whole grains, beans, apples, broccoli, carrots, peas, and barley. °General tips ° °· Work with your doctor to lose weight if you need to. °· Avoid: °? Foods with added sugar. °? Fried foods. °? Foods with partially hydrogenated oils. °· Limit alcohol intake to no more than 1 drink a day for nonpregnant women and 2 drinks a day for men. One drink equals 12 oz of beer, 5 oz of wine, or 1½ oz of hard liquor. °Reading food labels °· Check food labels for: °? Trans fats. °? Partially hydrogenated oils. °? Saturated fat (g) in each serving. °? Cholesterol (mg) in each serving. °? Fiber (g) in each serving. °· Choose foods with healthy fats, such as: °? Monounsaturated fats. °? Polyunsaturated fats. °? Omega-3 fats. °· Choose grain products that have whole grains. Look for the word "whole" as the first word in the ingredient list. °Cooking °· Cook foods using low-fat methods. These include baking, boiling, grilling, and broiling. °· Eat more home-cooked foods. Eat at restaurants and buffets  less often. °· Avoid cooking using saturated fats, such as butter, cream, palm oil, palm kernel oil, and coconut oil. °Recommended foods ° °Fruits °· All fresh, canned (in natural juice), or frozen fruits. °Vegetables °· Fresh or frozen vegetables (raw, steamed, roasted, or grilled). Green salads. °Grains °· Whole grains, such as whole wheat or whole grain breads, crackers, cereals, and pasta. Unsweetened oatmeal, bulgur, barley, quinoa, or brown rice. Corn or whole wheat flour tortillas. °Meats and other protein foods °· Ground beef (85% or leaner), grass-fed beef, or beef trimmed of fat. Skinless chicken or turkey. Ground chicken or turkey. Pork trimmed of fat. All fish and seafood. Egg whites. Dried beans, peas, or lentils. Unsalted nuts or seeds. Unsalted canned beans. Nut butters without added sugar or oil. °Dairy °· Low-fat or nonfat dairy products, such as skim or 1% milk, 2% or reduced-fat cheeses, low-fat and fat-free ricotta or cottage cheese, or plain low-fat and nonfat yogurt. °Fats and oils °· Tub margarine without trans fats. Light or reduced-fat mayonnaise and salad dressings. Avocado. Olive, canola, sesame, or safflower oils. °The items listed above may not be a complete list of foods and beverages you can eat. Contact a dietitian for more information. °Foods to avoid °Fruits °· Canned fruit in heavy syrup. Fruit in cream or butter sauce. Fried fruit. °Vegetables °· Vegetables cooked in cheese, cream, or butter sauce. Fried vegetables. °Grains °· White bread. White pasta. White rice. Cornbread. Bagels, pastries, and croissants. Crackers and snack foods that contain trans fat   and hydrogenated oils. Meats and other protein foods  Fatty cuts of meat. Ribs, chicken wings, bacon, sausage, bologna, salami, chitterlings, fatback, hot dogs, bratwurst, and packaged lunch meats. Liver and organ meats. Whole eggs and egg yolks. Chicken and [place] with skin. Fried meat. Dairy  Whole or 2% milk, cream,  half-and-half, and cream cheese. Whole milk cheeses. Whole-fat or sweetened yogurt. Full-fat cheeses. Nondairy creamers and whipped toppings. Processed cheese, cheese spreads, and cheese curds. Beverages  Alcohol. Sugar-sweetened drinks such as sodas, lemonade, and fruit drinks. Fats and oils  Butter, stick margarine, lard, shortening, ghee, or bacon fat. Coconut, palm kernel, and palm oils. Sweets and desserts  Corn syrup, sugars, honey, and molasses. Candy. Jam and jelly. Syrup. Sweetened cereals. Cookies, pies, cakes, donuts, muffins, and ice cream. The items listed above may not be a complete list of foods and beverages you should avoid. Contact a dietitian for more information. Summary  Choosing the right foods helps keep your fat and cholesterol at normal levels. This can keep you from getting certain diseases.  At meals, fill one-half of your plate with vegetables and green salads.  Eat high-fiber foods, like whole grains, beans, apples, carrots, peas, and barley.  Limit added sugar, saturated fats, alcohol, and fried foods. This information is not intended to replace advice given to you by your health care provider. Make sure you discuss any questions you have with your health care provider. Document Revised: 05/04/2018 Document Reviewed: 05/18/2017 Elsevier Patient Education  2020 [employer].  Hypertension, Adult Hypertension is another name for high blood pressure. High blood pressure forces your heart to work harder to pump blood. This can cause problems over time. There are two numbers in a blood pressure reading. There is a top number (systolic) over a bottom number (diastolic). It is best to have a blood pressure that is below 120/80. Healthy choices can help lower your blood pressure, or you may need medicine to help lower it. What are the causes? The cause of this condition is not known. Some conditions may be related to high blood pressure. What increases the  risk?  Smoking.  Having type 2 diabetes mellitus, high cholesterol, or both.  Not getting enough exercise or physical activity.  Being overweight.  Having too much fat, sugar, calories, or salt (sodium) in your diet.  Drinking too much alcohol.  Having long-term (chronic) kidney disease.  Having a family history of high blood pressure.  Age. Risk increases with age.  Race. You may be at higher risk if you are African American.  Gender. Men are at higher risk than women before age 70. After age 24, women are at higher risk than men.  Having obstructive sleep apnea.  Stress. What are the signs or symptoms?  High blood pressure may not cause symptoms. Very high blood pressure (hypertensive crisis) may cause: ? Headache. ? Feelings of worry or nervousness (anxiety). ? Shortness of breath. ? Nosebleed. ? A feeling of being sick to your stomach (nausea). ? Throwing up (vomiting). ? Changes in how you see. ? Very bad chest pain. ? Seizures. How is this treated?  This condition is treated by making healthy lifestyle changes, such as: ? Eating healthy foods. ? Exercising more. ? Drinking less alcohol.  Your health care provider may prescribe medicine if lifestyle changes are not enough to get your blood pressure under control, and if: ? Your top number is above 130. ? Your bottom number is above 80.  Your personal target blood pressure  may vary. Follow these instructions at home: Eating and drinking   If told, follow the DASH eating plan. To follow this plan: ? Fill one half of your plate at each meal with fruits and vegetables. ? Fill one fourth of your plate at each meal with whole grains. Whole grains include whole-wheat pasta, brown rice, and whole-grain bread. ? Eat or drink low-fat dairy products, such as skim milk or low-fat yogurt. ? Fill one fourth of your plate at each meal with low-fat (lean) proteins. Low-fat proteins include fish, chicken without skin,  eggs, beans, and tofu. ? Avoid fatty meat, cured and processed meat, or chicken with skin. ? Avoid pre-made or processed food.  Eat less than 1,500 mg of salt each day.  Do not drink alcohol if: ? Your doctor tells you not to drink. ? You are pregnant, may be pregnant, or are planning to become pregnant.  If you drink alcohol: ? Limit how much you use to:  0-1 drink a day for women.  0-2 drinks a day for men. ? Be aware of how much alcohol is in your drink. In the [place] one drink equals one 12 oz bottle of beer (355 mL), one 5 oz glass of wine (148 mL), or one 1 oz glass of hard liquor (44 mL). Lifestyle   Work with your doctor to stay at a healthy weight or to lose weight. Ask your doctor what the best weight is for you.  Get at least 30 minutes of exercise most days of the week. This may include walking, swimming, or biking.  Get at least 30 minutes of exercise that strengthens your muscles (resistance exercise) at least 3 days a week. This may include lifting weights or doing Pilates.  Do not use any products that contain nicotine or tobacco, such as cigarettes, e-cigarettes, and chewing tobacco. If you need help quitting, ask your doctor.  Check your blood pressure at home as told by your doctor.  Keep all follow-up visits as told by your doctor. This is important. Medicines  Take over-the-counter and prescription medicines only as told by your doctor. Follow directions carefully.  Do not skip doses of blood pressure medicine. The medicine does not work as well if you skip doses. Skipping doses also puts you at risk for problems.  Ask your doctor about side effects or reactions to medicines that you should watch for. Contact a doctor if you:  Think you are having a reaction to the medicine you are taking.  Have headaches that keep coming back (recurring).  Feel dizzy.  Have swelling in your ankles.  Have trouble with your vision. Get help right away if  you:  Get a very bad headache.  Start to feel mixed up (confused).  Feel weak or numb.  Feel faint.  Have very bad pain in your: ? Chest. ? Belly (abdomen).  Throw up more than once.  Have trouble breathing. Summary  Hypertension is another name for high blood pressure.  High blood pressure forces your heart to work harder to pump blood.  For most people, a normal blood pressure is less than 120/80.  Making healthy choices can help lower blood pressure. If your blood pressure does not get lower with healthy choices, you may need to take medicine. This information is not intended to replace advice given to you by your health care provider. Make sure you discuss any questions you have with your health care provider. Document Revised: 05/11/2018 Document Reviewed: 05/11/2018 Elsevier Patient  Education © 2020 Elsevier Inc. ° °

## 2020-04-29 NOTE — Assessment & Plan Note (Signed)
Patient is a 59 year old male who presents to clinic for follow-up hyperlipidemia.  Patient was diagnosed in 12/15/2015.  Compliance with treatment has been good.  Patient maintains a low cholesterol diet, follows up as directed.  And maintains an exercise regimen when he can because of his lower back pain.  Patient denies experiencing any hypercholesterolemia related symptoms. Provided education to patient with printed handouts given. Labs completed today CBC, CMP, lipid panel.  Refill medication sent to pharmacy.

## 2020-04-29 NOTE — Assessment & Plan Note (Signed)
Patient presents for follow-up of hypertension today.  Patient was diagnosed in 12/15/2015.  Patient is tolerating the medication well without side effects.  Compliance with treatment has been good, including taking medication as directed.  Maintains a healthy diet and follows up as directed.  Current medication metoprolol 100 mg 2 tablets by mouth daily.  No changes to current dose.  Provided education to patient which printed handouts given. Refill sent to pharmacy.

## 2020-04-30 LAB — CBC WITH DIFFERENTIAL/PLATELET
Basophils Absolute: 0 10*3/uL (ref 0.0–0.2)
Basos: 0 %
EOS (ABSOLUTE): 0.2 10*3/uL (ref 0.0–0.4)
Eos: 3 %
Hematocrit: 45.8 % (ref 37.5–51.0)
Hemoglobin: 15.7 g/dL (ref 13.0–17.7)
Immature Grans (Abs): 0 10*3/uL (ref 0.0–0.1)
Immature Granulocytes: 0 %
Lymphocytes Absolute: 3.2 10*3/uL — ABNORMAL HIGH (ref 0.7–3.1)
Lymphs: 37 %
MCH: 30.5 pg (ref 26.6–33.0)
MCHC: 34.3 g/dL (ref 31.5–35.7)
MCV: 89 fL (ref 79–97)
Monocytes Absolute: 0.5 10*3/uL (ref 0.1–0.9)
Monocytes: 6 %
Neutrophils Absolute: 4.5 10*3/uL (ref 1.4–7.0)
Neutrophils: 54 %
Platelets: 254 10*3/uL (ref 150–450)
RBC: 5.14 x10E6/uL (ref 4.14–5.80)
RDW: 13.5 % (ref 11.6–15.4)
WBC: 8.5 10*3/uL (ref 3.4–10.8)

## 2020-04-30 LAB — LIPID PANEL
Chol/HDL Ratio: 9.5 ratio — ABNORMAL HIGH (ref 0.0–5.0)
Cholesterol, Total: 247 mg/dL — ABNORMAL HIGH (ref 100–199)
HDL: 26 mg/dL — ABNORMAL LOW (ref >39)
LDL Chol Calc (NIH): 180 mg/dL — ABNORMAL HIGH (ref 0–99)
Triglycerides: 213 mg/dL — ABNORMAL HIGH (ref 0–149)
VLDL Cholesterol Cal: 41 mg/dL — ABNORMAL HIGH (ref 5–40)

## 2020-04-30 LAB — TSH: TSH: 1.61 u[IU]/mL (ref 0.450–4.500)

## 2020-07-20 DIAGNOSIS — R0989 Other specified symptoms and signs involving the circulatory and respiratory systems: Secondary | ICD-10-CM | POA: Diagnosis not present

## 2020-07-20 DIAGNOSIS — I10 Essential (primary) hypertension: Secondary | ICD-10-CM | POA: Diagnosis not present

## 2020-07-20 DIAGNOSIS — J0110 Acute frontal sinusitis, unspecified: Secondary | ICD-10-CM | POA: Diagnosis not present

## 2020-07-20 DIAGNOSIS — R059 Cough, unspecified: Secondary | ICD-10-CM | POA: Diagnosis not present

## 2020-07-20 DIAGNOSIS — J029 Acute pharyngitis, unspecified: Secondary | ICD-10-CM | POA: Diagnosis not present

## 2020-07-20 DIAGNOSIS — F17210 Nicotine dependence, cigarettes, uncomplicated: Secondary | ICD-10-CM | POA: Diagnosis not present

## 2020-08-14 DIAGNOSIS — I10 Essential (primary) hypertension: Secondary | ICD-10-CM | POA: Diagnosis not present

## 2020-08-14 DIAGNOSIS — M549 Dorsalgia, unspecified: Secondary | ICD-10-CM | POA: Diagnosis not present

## 2020-08-14 DIAGNOSIS — Z720 Tobacco use: Secondary | ICD-10-CM | POA: Diagnosis not present

## 2020-08-14 DIAGNOSIS — R262 Difficulty in walking, not elsewhere classified: Secondary | ICD-10-CM | POA: Diagnosis not present

## 2020-08-14 DIAGNOSIS — M5440 Lumbago with sciatica, unspecified side: Secondary | ICD-10-CM | POA: Diagnosis not present

## 2020-09-08 DIAGNOSIS — I714 Abdominal aortic aneurysm, without rupture: Secondary | ICD-10-CM | POA: Diagnosis not present

## 2020-09-08 DIAGNOSIS — Z720 Tobacco use: Secondary | ICD-10-CM | POA: Diagnosis not present

## 2020-09-08 DIAGNOSIS — M25562 Pain in left knee: Secondary | ICD-10-CM | POA: Diagnosis not present

## 2020-09-08 DIAGNOSIS — I10 Essential (primary) hypertension: Secondary | ICD-10-CM | POA: Diagnosis not present

## 2020-09-18 ENCOUNTER — Other Ambulatory Visit: Payer: Self-pay

## 2020-09-18 ENCOUNTER — Ambulatory Visit (INDEPENDENT_AMBULATORY_CARE_PROVIDER_SITE_OTHER): Payer: Medicare HMO | Admitting: Family Medicine

## 2020-09-18 ENCOUNTER — Encounter: Payer: Self-pay | Admitting: Family Medicine

## 2020-09-18 VITALS — BP 135/80 | HR 70 | Temp 98.0°F | Ht 71.0 in | Wt 182.4 lb

## 2020-09-18 DIAGNOSIS — M5416 Radiculopathy, lumbar region: Secondary | ICD-10-CM

## 2020-09-18 DIAGNOSIS — M5136 Other intervertebral disc degeneration, lumbar region: Secondary | ICD-10-CM | POA: Diagnosis not present

## 2020-09-18 MED ORDER — PREGABALIN 50 MG PO CAPS
ORAL_CAPSULE | ORAL | 0 refills | Status: DC
Start: 2020-09-18 — End: 2020-10-21

## 2020-09-18 MED ORDER — PREDNISONE 10 MG PO TABS
ORAL_TABLET | ORAL | 0 refills | Status: DC
Start: 2020-09-18 — End: 2020-10-21

## 2020-09-18 NOTE — Progress Notes (Signed)
Subjective:  Patient ID: Jerry Gill, male    DOB: 1961/08/14  Age: 60 y.o. MRN: [id number]  CC: ER Follow up ([facility] /Left hip/knee pain-shooting/Sciatica/Arthritis/)   HPI Jerry Gill presents for follow-up from the life right emergency department at [facility].  He was there a few days ago and was given a cortisone shot and Toradol.  He has pain in the left lumbar region radiating to the posterior lateral aspect of the left thigh.  This happened when he was working on replacing wheel bearings on his daughter's car.  It was approximately 10 days ago.  Pain has been increasing.  It is 7-8/10 at times.  Depression screen [facility] 2/9 09/18/2020 04/29/2020 03/15/2018  Decreased Interest 0 0 0  Down, Depressed, Hopeless 0 0 0  PHQ - 2 Score 0 0 0    History Jerry Gill has a past medical history of AAA (abdominal aortic aneurysm) (HCC), Collagen vascular disease (HCC), DDD (degenerative disc disease), lumbar, and Hypertension.   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   His family history includes AAA (abdominal aortic aneurysm) in his father; Cancer in his father; Heart disease in his father and mother; Hyperlipidemia in his brother; Hypertension in his mother.He reports that he has been smoking cigarettes. He has a 23.50 pack-year smoking history. He has never used smokeless tobacco. He reports that he does not drink alcohol and does not use drugs.    ROS Review of Systems  Constitutional: Positive for activity change.  HENT: Negative.   Respiratory: Negative for cough and shortness of breath.   Cardiovascular: Negative for chest pain.  Gastrointestinal: Negative for abdominal pain.  Musculoskeletal: Positive for arthralgias and back pain.    Objective:  BP 135/80   Pulse 70   Temp 98 F (36.7 C) (Temporal)   Ht 5\' 11"  (1.803 m)   Wt 182 lb 6.4 oz (82.7 kg)    BMI 25.44 kg/m   BP Readings from Last 3 Encounters:  09/18/20 135/80  04/29/20 136/89  03/15/18 131/89    Wt Readings from Last 3 Encounters:  09/18/20 182 lb 6.4 oz (82.7 kg)  04/29/20 176 lb 9.6 oz (80.1 kg)  03/15/18 162 lb 6.4 oz (73.7 kg)     Physical Exam Vitals reviewed.  Constitutional:      Appearance: He is well-developed and well-nourished.  HENT:     Head: Normocephalic and atraumatic.     Right Ear: External ear normal.     Left Ear: External ear normal.     Mouth/Throat:     Pharynx: No oropharyngeal exudate or posterior oropharyngeal erythema.  Eyes:     Pupils: Pupils are equal, round, and reactive to light.  Cardiovascular:     Rate and Rhythm: Normal rate and regular rhythm.     Heart sounds: No murmur heard.   Pulmonary:     Effort: No respiratory distress.     Breath sounds: Normal breath sounds.  Musculoskeletal:        General: Tenderness (Left lumbar paraspinous musculature) present.     Cervical back: Normal range of motion and neck supple.  Neurological:     Mental Status: He is alert and oriented to person, place, and time.       Assessment & Plan:   Jerry Gill was seen today for er follow up.  Diagnoses and all orders for this visit:  DDD (degenerative disc disease), lumbar  Lumbar radiculopathy, acute  Other orders -     pregabalin (LYRICA) 50 MG capsule; 1 qhs X4 days , then 2 qhs X 4d, then 3 qhs X 4d, then 4 qhs -     predniSONE (DELTASONE) 10 MG tablet; Take 5 daily for 3 days followed by 4,3,2 and 1 for 3 days each.       I have discontinued Jerry Gill's azithromycin. I am also having him start on pregabalin and predniSONE. Additionally, I am having him maintain his aspirin EC, Multiple Vitamins-Minerals (MULTIVITAMIN GUMMIES MENS PO), traMADol, sildenafil, omeprazole, fluticasone, clobetasol, albuterol, and metoprolol succinate.  Allergies as of 09/18/2020      Reactions   Codeine Nausea And Vomiting   Penicillins  Itching, Rash   Has patient had a PCN reaction causing immediate rash, facial/tongue/throat swelling, SOB or lightheadedness with hypotension: Unknown, childhood reaction Has patient had a PCN reaction causing severe rash involving mucus membranes or skin necrosis: No Has patient had a PCN reaction that required hospitalization No Has patient had a PCN reaction occurring within the last 10 years: No If all of the above answers are "NO", then may proceed with Cephalosporin use.   Sulfa Antibiotics Itching      Medication List       Accurate as of September 18, 2020  5:53 PM. If you have any questions, ask your nurse or doctor.        STOP taking these medications   azithromycin 250 MG tablet Commonly known as: Zithromax Z-Pak Stopped by: Mechele Claude, MD     TAKE these medications   albuterol 108 (90 Base) MCG/ACT inhaler Commonly known as: VENTOLIN HFA Inhale 2 puffs into the lungs every 4 (four) hours as needed.   aspirin EC 81 MG tablet Take 81 mg by mouth every evening.   clobetasol 0.05 % external solution Commonly known as: TEMOVATE Apply 1 application topically 2 (two) times daily.   fluticasone 50 MCG/ACT nasal spray Commonly known as: FLONASE Place 1 spray into both nostrils daily as needed for allergies or rhinitis. (Needs to be seen before next refill)   metoprolol succinate 100 MG 24 hr tablet Commonly known as: TOPROL-XL TAKE 2 TABLETS BY MOUTH DAILY. TAKE 1&1/2 TABLETS MOST DAYS (Needs to be seen before next refill)   MULTIVITAMIN GUMMIES MENS PO Take 1 tablet by mouth 2 (two) times daily.   omeprazole 20 MG capsule Commonly known as: PRILOSEC Take 1 capsule (20 mg total) by mouth daily. For reflux   predniSONE 10 MG tablet Commonly known as: DELTASONE Take 5 daily for 3 days followed by 4,3,2 and 1 for 3 days each. Started by: Mechele Claude, MD   pregabalin 50 MG capsule Commonly known as: Lyrica 1 qhs X4 days , then 2 qhs X 4d, then 3 qhs X 4d,  then 4 qhs Started by: Mechele Claude, MD   sildenafil 20 MG tablet Commonly known as: REVATIO TAKE 2-5 TABLETS AS NEEDED PRIOR TO SEXUAL ACTIVITY   traMADol 50 MG tablet Commonly known as: ULTRAM Take 1 tablet (50 mg total) by mouth every 6 (six) hours as needed.        Follow-up: Return in about 1 month (around 10/19/2020).  Mechele Claude, M.D.

## 2020-09-19 ENCOUNTER — Other Ambulatory Visit: Payer: Self-pay | Admitting: Family

## 2020-09-19 DIAGNOSIS — I10 Essential (primary) hypertension: Secondary | ICD-10-CM

## 2020-10-16 DIAGNOSIS — Z029 Encounter for administrative examinations, unspecified: Secondary | ICD-10-CM

## 2020-10-21 ENCOUNTER — Encounter: Payer: Self-pay | Admitting: Family Medicine

## 2020-10-21 ENCOUNTER — Ambulatory Visit (INDEPENDENT_AMBULATORY_CARE_PROVIDER_SITE_OTHER): Payer: Medicare HMO | Admitting: Family Medicine

## 2020-10-21 ENCOUNTER — Other Ambulatory Visit: Payer: Self-pay

## 2020-10-21 VITALS — BP 131/88 | HR 74 | Temp 98.1°F | Ht 71.0 in | Wt 190.0 lb

## 2020-10-21 DIAGNOSIS — M5030 Other cervical disc degeneration, unspecified cervical region: Secondary | ICD-10-CM

## 2020-10-21 DIAGNOSIS — M5416 Radiculopathy, lumbar region: Secondary | ICD-10-CM

## 2020-10-21 DIAGNOSIS — M5136 Other intervertebral disc degeneration, lumbar region: Secondary | ICD-10-CM

## 2020-10-21 DIAGNOSIS — I10 Essential (primary) hypertension: Secondary | ICD-10-CM

## 2020-10-21 MED ORDER — PREGABALIN 300 MG PO CAPS
300.0000 mg | ORAL_CAPSULE | Freq: Every day | ORAL | 1 refills | Status: DC
Start: 2020-10-21 — End: 2021-07-30

## 2020-10-21 NOTE — Progress Notes (Signed)
Subjective:  Patient ID: Jerry Gill, male    DOB: July 03, 1961  Age: 60 y.o. MRN: [id number]  CC: Medical Management of Chronic Issues (1 month recheck DDD/)   HPI Scharlene Corn presents for follow-up of hypertension. Patient has no history of headache chest pain or shortness of breath or recent cough. Patient also denies symptoms of TIA such as numbness weakness lateralizing. Patient checks  blood pressure at home and has not had any elevated readings recently. Patient denies side effects from his medication. States taking it regularly. .  Patient also is having pain at the left lumbar region which radiates to the anterior thigh on the left.  It is 8-9/10 in severity.  It is constant.  It reduces his mobility and activity level.  He has tolerated a titration of Lyrica.  However it does not seem to be helping significantly at this time.  Depression screen [facility] 2/9 10/21/2020 09/18/2020 04/29/2020  Decreased Interest 0 0 0  Down, Depressed, Hopeless 0 0 0  PHQ - 2 Score 0 0 0    History Nirav has a past medical history of AAA (abdominal aortic aneurysm) (HCC), Collagen vascular disease (HCC), DDD (degenerative disc disease), lumbar, and Hypertension.   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   His family history includes AAA (abdominal aortic aneurysm) in his father; Cancer in his father; Heart disease in his father and mother; Hyperlipidemia in his brother; Hypertension in his mother.He reports that he has been smoking cigarettes. He has a 23.50 pack-year smoking history. He has never used smokeless tobacco. He reports that he does not drink alcohol and does not use drugs.    ROS Review of Systems  Constitutional: Negative for fever.  Respiratory: Negative for shortness of breath.   Cardiovascular: Negative for chest pain.  Musculoskeletal: Negative for arthralgias.  Skin:  Negative for rash.    Objective:  BP 131/88   Pulse 74   Temp 98.1 F (36.7 C) (Temporal)   Ht 5\' 11"  (1.803 m)   Wt 190 lb (86.2 kg)   BMI 26.50 kg/m   BP Readings from Last 3 Encounters:  10/21/20 131/88  09/18/20 135/80  04/29/20 136/89    Wt Readings from Last 3 Encounters:  10/21/20 190 lb (86.2 kg)  09/18/20 182 lb 6.4 oz (82.7 kg)  04/29/20 176 lb 9.6 oz (80.1 kg)     Physical Exam Vitals reviewed.  Constitutional:      Appearance: He is well-developed and well-nourished.  HENT:     Head: Normocephalic and atraumatic.     Right Ear: Tympanic membrane and external ear normal. No decreased hearing noted.     Left Ear: Tympanic membrane and external ear normal. No decreased hearing noted.     Mouth/Throat:     Pharynx: No oropharyngeal exudate or posterior oropharyngeal erythema.  Eyes:     Pupils: Pupils are equal, round, and reactive to light.  Cardiovascular:     Rate and Rhythm: Normal rate and regular rhythm.     Heart sounds: No murmur heard.   Pulmonary:     Effort: No respiratory distress.     Breath sounds: Normal breath sounds.  Abdominal:     General: Bowel sounds are normal.     Palpations: Abdomen is soft. There is no mass.     Tenderness: There is no abdominal tenderness.  Musculoskeletal:     Cervical back: Normal range of  motion and neck supple.       Assessment & Plan:   Avir was seen today for medical management of chronic issues.  Diagnoses and all orders for this visit:  Essential hypertension  DDD (degenerative disc disease), lumbar -     MR LUMBAR SPINE WO CONTRAST; Future  DDD (degenerative disc disease), cervical  Lumbar radiculopathy -     MR LUMBAR SPINE WO CONTRAST; Future  Other orders -     pregabalin (LYRICA) 300 MG capsule; Take 1 capsule (300 mg total) by mouth at bedtime.       I have discontinued Orest Servidio's predniSONE. I have also changed his pregabalin. Additionally, I am having him maintain  his aspirin EC, Multiple Vitamins-Minerals (MULTIVITAMIN GUMMIES MENS PO), traMADol, sildenafil, omeprazole, fluticasone, clobetasol, albuterol, and metoprolol succinate.  Allergies as of 10/21/2020      Reactions   Codeine Nausea And Vomiting   Penicillins Itching, Rash   Has patient had a PCN reaction causing immediate rash, facial/tongue/throat swelling, SOB or lightheadedness with hypotension: Unknown, childhood reaction Has patient had a PCN reaction causing severe rash involving mucus membranes or skin necrosis: No Has patient had a PCN reaction that required hospitalization No Has patient had a PCN reaction occurring within the last 10 years: No If all of the above answers are "NO", then may proceed with Cephalosporin use.   Sulfa Antibiotics Itching      Medication List       Accurate as of October 21, 2020 11:59 PM. If you have any questions, ask your nurse or doctor.        STOP taking these medications   predniSONE 10 MG tablet Commonly known as: DELTASONE Stopped by: Mechele Claude, MD     TAKE these medications   albuterol 108 (90 Base) MCG/ACT inhaler Commonly known as: VENTOLIN HFA Inhale 2 puffs into the lungs every 4 (four) hours as needed.   aspirin EC 81 MG tablet Take 81 mg by mouth every evening.   clobetasol 0.05 % external solution Commonly known as: TEMOVATE Apply 1 application topically 2 (two) times daily.   fluticasone 50 MCG/ACT nasal spray Commonly known as: FLONASE Place 1 spray into both nostrils daily as needed for allergies or rhinitis. (Needs to be seen before next refill)   metoprolol succinate 100 MG 24 hr tablet Commonly known as: TOPROL-XL TAKE TWO TABLETS BY MOUTH DAILY   MULTIVITAMIN GUMMIES MENS PO Take 1 tablet by mouth 2 (two) times daily.   omeprazole 20 MG capsule Commonly known as: PRILOSEC Take 1 capsule (20 mg total) by mouth daily. For reflux   pregabalin 300 MG capsule Commonly known as: Lyrica Take 1 capsule (300  mg total) by mouth at bedtime. What changed:   medication strength  how much to take  how to take this  when to take this  additional instructions Changed by: Mechele Claude, MD   sildenafil 20 MG tablet Commonly known as: REVATIO TAKE 2-5 TABLETS AS NEEDED PRIOR TO SEXUAL ACTIVITY   traMADol 50 MG tablet Commonly known as: ULTRAM Take 1 tablet (50 mg total) by mouth every 6 (six) hours as needed.        Follow-up: No follow-ups on file.  Mechele Claude, M.D.

## 2020-10-25 ENCOUNTER — Encounter: Payer: Self-pay | Admitting: Family Medicine

## 2020-11-01 ENCOUNTER — Telehealth: Payer: Self-pay | Admitting: Family Medicine

## 2020-11-19 ENCOUNTER — Other Ambulatory Visit: Payer: Self-pay | Admitting: *Deleted

## 2020-11-19 DIAGNOSIS — I10 Essential (primary) hypertension: Secondary | ICD-10-CM

## 2020-11-19 MED ORDER — METOPROLOL SUCCINATE ER 100 MG PO TB24: ORAL_TABLET | ORAL | 4 refills | Status: DC

## 2021-01-17 DIAGNOSIS — J329 Chronic sinusitis, unspecified: Secondary | ICD-10-CM | POA: Diagnosis not present

## 2021-01-17 DIAGNOSIS — F17210 Nicotine dependence, cigarettes, uncomplicated: Secondary | ICD-10-CM | POA: Diagnosis not present

## 2021-01-17 DIAGNOSIS — Z720 Tobacco use: Secondary | ICD-10-CM | POA: Diagnosis not present

## 2021-01-17 DIAGNOSIS — R059 Cough, unspecified: Secondary | ICD-10-CM | POA: Diagnosis not present

## 2021-01-17 DIAGNOSIS — I10 Essential (primary) hypertension: Secondary | ICD-10-CM | POA: Diagnosis not present

## 2021-01-17 DIAGNOSIS — R0981 Nasal congestion: Secondary | ICD-10-CM | POA: Diagnosis not present

## 2021-01-17 DIAGNOSIS — Z20822 Contact with and (suspected) exposure to covid-19: Secondary | ICD-10-CM | POA: Diagnosis not present

## 2021-01-17 DIAGNOSIS — Z28310 Unvaccinated for covid-19: Secondary | ICD-10-CM | POA: Diagnosis not present

## 2021-03-07 DIAGNOSIS — H2512 Age-related nuclear cataract, left eye: Secondary | ICD-10-CM | POA: Diagnosis not present

## 2021-03-07 DIAGNOSIS — H2511 Age-related nuclear cataract, right eye: Secondary | ICD-10-CM | POA: Diagnosis not present

## 2021-03-07 DIAGNOSIS — H52209 Unspecified astigmatism, unspecified eye: Secondary | ICD-10-CM | POA: Diagnosis not present

## 2021-03-07 DIAGNOSIS — H524 Presbyopia: Secondary | ICD-10-CM | POA: Diagnosis not present

## 2021-03-07 DIAGNOSIS — H5203 Hypermetropia, bilateral: Secondary | ICD-10-CM | POA: Diagnosis not present

## 2021-03-26 DIAGNOSIS — L0591 Pilonidal cyst without abscess: Secondary | ICD-10-CM | POA: Diagnosis not present

## 2021-03-26 DIAGNOSIS — Z28310 Unvaccinated for covid-19: Secondary | ICD-10-CM | POA: Diagnosis not present

## 2021-03-26 DIAGNOSIS — I714 Abdominal aortic aneurysm, without rupture: Secondary | ICD-10-CM | POA: Diagnosis not present

## 2021-03-26 DIAGNOSIS — Z720 Tobacco use: Secondary | ICD-10-CM | POA: Diagnosis not present

## 2021-03-26 DIAGNOSIS — L0232 Furuncle of buttock: Secondary | ICD-10-CM | POA: Diagnosis not present

## 2021-03-26 DIAGNOSIS — I10 Essential (primary) hypertension: Secondary | ICD-10-CM | POA: Diagnosis not present

## 2021-05-10 DIAGNOSIS — R197 Diarrhea, unspecified: Secondary | ICD-10-CM | POA: Diagnosis not present

## 2021-05-10 DIAGNOSIS — R0989 Other specified symptoms and signs involving the circulatory and respiratory systems: Secondary | ICD-10-CM | POA: Diagnosis not present

## 2021-05-10 DIAGNOSIS — R509 Fever, unspecified: Secondary | ICD-10-CM | POA: Diagnosis not present

## 2021-05-10 DIAGNOSIS — U071 COVID-19: Secondary | ICD-10-CM | POA: Diagnosis not present

## 2021-05-10 DIAGNOSIS — I739 Peripheral vascular disease, unspecified: Secondary | ICD-10-CM | POA: Diagnosis not present

## 2021-05-10 DIAGNOSIS — R5383 Other fatigue: Secondary | ICD-10-CM | POA: Diagnosis not present

## 2021-05-10 DIAGNOSIS — I10 Essential (primary) hypertension: Secondary | ICD-10-CM | POA: Diagnosis not present

## 2021-05-10 DIAGNOSIS — R059 Cough, unspecified: Secondary | ICD-10-CM | POA: Diagnosis not present

## 2021-05-10 DIAGNOSIS — R519 Headache, unspecified: Secondary | ICD-10-CM | POA: Diagnosis not present

## 2021-06-17 ENCOUNTER — Other Ambulatory Visit: Payer: Self-pay | Admitting: Family Medicine

## 2021-06-17 DIAGNOSIS — N529 Male erectile dysfunction, unspecified: Secondary | ICD-10-CM

## 2021-07-30 ENCOUNTER — Ambulatory Visit (INDEPENDENT_AMBULATORY_CARE_PROVIDER_SITE_OTHER): Payer: Medicare HMO | Admitting: Family Medicine

## 2021-07-30 ENCOUNTER — Other Ambulatory Visit: Payer: Self-pay

## 2021-07-30 ENCOUNTER — Encounter: Payer: Self-pay | Admitting: Family Medicine

## 2021-07-30 VITALS — BP 148/85 | HR 57 | Temp 97.5°F | Ht 71.0 in | Wt 178.0 lb

## 2021-07-30 DIAGNOSIS — Z122 Encounter for screening for malignant neoplasm of respiratory organs: Secondary | ICD-10-CM | POA: Diagnosis not present

## 2021-07-30 DIAGNOSIS — I10 Essential (primary) hypertension: Secondary | ICD-10-CM | POA: Diagnosis not present

## 2021-07-30 DIAGNOSIS — Z125 Encounter for screening for malignant neoplasm of prostate: Secondary | ICD-10-CM | POA: Diagnosis not present

## 2021-07-30 DIAGNOSIS — E785 Hyperlipidemia, unspecified: Secondary | ICD-10-CM | POA: Diagnosis not present

## 2021-07-30 DIAGNOSIS — Z87891 Personal history of nicotine dependence: Secondary | ICD-10-CM

## 2021-07-30 MED ORDER — METOPROLOL SUCCINATE ER 100 MG PO TB24: ORAL_TABLET | ORAL | 3 refills | Status: DC

## 2021-07-30 MED ORDER — OMEPRAZOLE 20 MG PO CPDR: 20.0000 mg | DELAYED_RELEASE_CAPSULE | Freq: Every day | ORAL | 3 refills | Status: DC

## 2021-07-30 NOTE — Progress Notes (Signed)
Subjective:  Patient ID: Jerry Gill, male    DOB: 1961/01/20  Age: 60 y.o. MRN: [id number]  CC: Medical Management of Chronic Issues   HPI Jerry Gill presents for  presents for  follow-up of hypertension. Patient has no history of headache chest pain or shortness of breath or recent cough. Patient also denies symptoms of TIA such as focal numbness or weakness. Patient denies side effects from medication. States taking it regularly. BP runs 811S systolic except at dr. Gabriel Carina or funeral home  Still smoking a pack a day. Smoked 2 packs a day for many years until last year. Has 80+ pack year hx.    Depression screen [facility] 2/9 07/30/2021 10/21/2020 09/18/2020  Decreased Interest 0 0 0  Down, Depressed, Hopeless 0 0 0  PHQ - 2 Score 0 0 0    History Jerry Gill has a past medical history of AAA (abdominal aortic aneurysm), Collagen vascular disease ([place]), DDD (degenerative disc disease), lumbar, and Hypertension.   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   His family history includes AAA (abdominal aortic aneurysm) in his father; Cancer in his father; Heart disease in his father and mother; Hyperlipidemia in his brother; Hypertension in his mother.He reports that he has been smoking cigarettes. He has a 23.50 pack-year smoking history. He has never used smokeless tobacco. He reports that he does not drink alcohol and does not use drugs.    ROS Review of Systems  Constitutional:  Negative for fever.  Respiratory:  Negative for shortness of breath.   Cardiovascular:  Negative for chest pain.  Musculoskeletal:  Negative for arthralgias.  Skin:  Negative for rash.   Objective:  BP (!) 148/85   Pulse (!) 57   Temp (!) 97.5 F (36.4 C)   Ht '5\' 11"'  (1.803 m)   Wt 178 lb (80.7 kg)   SpO2 98%   BMI 24.83 kg/m   BP Readings from Last 3 Encounters:  07/30/21 (!) 148/85  10/21/20  131/88  09/18/20 135/80    Wt Readings from Last 3 Encounters:  07/30/21 178 lb (80.7 kg)  10/21/20 190 lb (86.2 kg)  09/18/20 182 lb 6.4 oz (82.7 kg)     Physical Exam Constitutional:      General: He is not in acute distress.    Appearance: He is well-developed.  HENT:     Head: Normocephalic and atraumatic.     Right Ear: External ear normal.     Left Ear: External ear normal.     Nose: Nose normal.  Eyes:     Conjunctiva/sclera: Conjunctivae normal.     Pupils: Pupils are equal, round, and reactive to light.  Cardiovascular:     Rate and Rhythm: Normal rate and regular rhythm.     Heart sounds: Normal heart sounds. No murmur heard. Pulmonary:     Effort: Pulmonary effort is normal. No respiratory distress.     Breath sounds: Normal breath sounds. No wheezing or rales.  Abdominal:     Palpations: Abdomen is soft.     Tenderness: There is no abdominal tenderness.  Musculoskeletal:        General: Normal range of motion.     Cervical back: Normal range of motion and neck supple.  Skin:    General: Skin is warm and dry.  Neurological:     Mental Status: He is alert and oriented to person, place, and time.  Deep Tendon Reflexes: Reflexes are normal and symmetric.  Psychiatric:        Behavior: Behavior normal.        Thought Content: Thought content normal.        Judgment: Judgment normal.      Assessment & Plan:   Jerry Gill was seen today for medical management of chronic issues.  Diagnoses and all orders for this visit:  Essential hypertension -     CBC with Differential/Platelet -     CMP14+EGFR -     metoprolol succinate (TOPROL-XL) 100 MG 24 hr tablet; TAKE TWO TABLETS BY MOUTH DAILY -     BMP8+EGFR  Hyperlipidemia, unspecified hyperlipidemia type -     Lipid panel  Prostate cancer screening -     PSA, total and free  Encounter for screening for malignant neoplasm of lung in former smoker who quit in past 15 years with 30 pack year history or  greater -     CT CHEST LUNG CANCER SCREENING LOW DOSE WO CONTRAST; Future  Other orders -     omeprazole (PRILOSEC) 20 MG capsule; Take 1 capsule (20 mg total) by mouth daily. For reflux      I have discontinued Jerry Gill's traMADol and pregabalin. I am also having him maintain his aspirin EC, Multiple Vitamins-Minerals (MULTIVITAMIN GUMMIES MENS PO), fluticasone, clobetasol, albuterol, sildenafil, metoprolol succinate, and omeprazole.  Allergies as of 07/30/2021       Reactions   Codeine Nausea And Vomiting   Penicillins Itching, Rash   Has patient had a PCN reaction causing immediate rash, facial/tongue/throat swelling, SOB or lightheadedness with hypotension: Unknown, childhood reaction Has patient had a PCN reaction causing severe rash involving mucus membranes or skin necrosis: No Has patient had a PCN reaction that required hospitalization No Has patient had a PCN reaction occurring within the last 10 years: No If all of the above answers are "NO", then may proceed with Cephalosporin use.   Sulfa Antibiotics Itching        Medication List        Accurate as of July 30, 2021 11:00 AM. If you have any questions, ask your nurse or doctor.          STOP taking these medications    pregabalin 300 MG capsule Commonly known as: Lyrica Stopped by: Claretta Fraise, MD   traMADol 50 MG tablet Commonly known as: ULTRAM Stopped by: Claretta Fraise, MD       TAKE these medications    albuterol 108 (90 Base) MCG/ACT inhaler Commonly known as: VENTOLIN HFA Inhale 2 puffs into the lungs every 4 (four) hours as needed.   aspirin EC 81 MG tablet Take 81 mg by mouth every evening.   clobetasol 0.05 % external solution Commonly known as: TEMOVATE Apply 1 application topically 2 (two) times daily.   fluticasone 50 MCG/ACT nasal spray Commonly known as: FLONASE Place 1 spray into both nostrils daily as needed for allergies or rhinitis. (Needs to be seen before  next refill)   metoprolol succinate 100 MG 24 hr tablet Commonly known as: TOPROL-XL TAKE TWO TABLETS BY MOUTH DAILY   MULTIVITAMIN GUMMIES MENS PO Take 1 tablet by mouth 2 (two) times daily.   omeprazole 20 MG capsule Commonly known as: PRILOSEC Take 1 capsule (20 mg total) by mouth daily. For reflux   sildenafil 20 MG tablet Commonly known as: REVATIO TAKE 2-5 TABLETS AS NEEDED PRIOR TO SEXUAL ACTIVITY  Follow-up: Return in about 6 months (around 01/27/2022).  Claretta Fraise, M.D.

## 2021-07-31 ENCOUNTER — Other Ambulatory Visit: Payer: Self-pay | Admitting: Family Medicine

## 2021-07-31 LAB — CBC WITH DIFFERENTIAL/PLATELET
Basophils Absolute: 0 10*3/uL (ref 0.0–0.2)
Basos: 0 %
EOS (ABSOLUTE): 0.2 10*3/uL (ref 0.0–0.4)
Eos: 3 %
Hematocrit: 45.4 % (ref 37.5–51.0)
Hemoglobin: 15.7 g/dL (ref 13.0–17.7)
Immature Grans (Abs): 0 10*3/uL (ref 0.0–0.1)
Immature Granulocytes: 0 %
Lymphocytes Absolute: 1.9 10*3/uL (ref 0.7–3.1)
Lymphs: 35 %
MCH: 30.6 pg (ref 26.6–33.0)
MCHC: 34.6 g/dL (ref 31.5–35.7)
MCV: 89 fL (ref 79–97)
Monocytes Absolute: 0.4 10*3/uL (ref 0.1–0.9)
Monocytes: 8 %
Neutrophils Absolute: 3 10*3/uL (ref 1.4–7.0)
Neutrophils: 54 %
Platelets: 235 10*3/uL (ref 150–450)
RBC: 5.13 x10E6/uL (ref 4.14–5.80)
RDW: 12.9 % (ref 11.6–15.4)
WBC: 5.5 10*3/uL (ref 3.4–10.8)

## 2021-07-31 LAB — CMP14+EGFR
ALT: 13 IU/L (ref 0–44)
AST: 14 IU/L (ref 0–40)
Albumin/Globulin Ratio: 2.2 (ref 1.2–2.2)
Albumin: 4.6 g/dL (ref 3.8–4.8)
Alkaline Phosphatase: 69 IU/L (ref 44–121)
BUN/Creatinine Ratio: 16 (ref 10–24)
BUN: 14 mg/dL (ref 8–27)
Bilirubin Total: 0.3 mg/dL (ref 0.0–1.2)
CO2: 23 mmol/L (ref 20–29)
Calcium: 9.5 mg/dL (ref 8.6–10.2)
Chloride: 104 mmol/L (ref 96–106)
Creatinine, Ser: 0.9 mg/dL (ref 0.76–1.27)
Globulin, Total: 2.1 g/dL (ref 1.5–4.5)
Glucose: 90 mg/dL (ref 70–99)
Potassium: 4.9 mmol/L (ref 3.5–5.2)
Sodium: 140 mmol/L (ref 134–144)
Total Protein: 6.7 g/dL (ref 6.0–8.5)
eGFR: 97 mL/min/{1.73_m2} (ref >59)

## 2021-07-31 LAB — PSA, TOTAL AND FREE
PSA, Free Pct: 37.5 %
PSA, Free: 0.15 ng/mL
Prostate Specific Ag, Serum: 0.4 ng/mL (ref 0.0–4.0)

## 2021-07-31 LAB — LIPID PANEL
Chol/HDL Ratio: 8 ratio — ABNORMAL HIGH (ref 0.0–5.0)
Cholesterol, Total: 215 mg/dL — ABNORMAL HIGH (ref 100–199)
HDL: 27 mg/dL — ABNORMAL LOW (ref >39)
LDL Chol Calc (NIH): 160 mg/dL — ABNORMAL HIGH (ref 0–99)
Triglycerides: 152 mg/dL — ABNORMAL HIGH (ref 0–149)
VLDL Cholesterol Cal: 28 mg/dL (ref 5–40)

## 2021-07-31 MED ORDER — ROSUVASTATIN CALCIUM 5 MG PO TABS: 5.0000 mg | ORAL_TABLET | Freq: Every day | ORAL | 3 refills | Status: DC

## 2021-09-02 ENCOUNTER — Ambulatory Visit: Payer: Medicare HMO

## 2021-09-03 ENCOUNTER — Telehealth: Payer: Self-pay | Admitting: Family Medicine

## 2021-09-03 NOTE — Telephone Encounter (Signed)
°  Left message for patient to call back and schedule Medicare Annual Wellness Visit (AWV) to be completed by video or phone. ° °No hx of AWV  ° °Please schedule at anytime with [facility] Nurse Health Advisor --- Mary Jane ° °45 Minutes appointment  ° °Any questions, please call me at [phone]   °

## 2021-09-20 DIAGNOSIS — Z041 Encounter for examination and observation following transport accident: Secondary | ICD-10-CM | POA: Diagnosis not present

## 2021-10-02 ENCOUNTER — Ambulatory Visit (INDEPENDENT_AMBULATORY_CARE_PROVIDER_SITE_OTHER): Payer: Medicare HMO

## 2021-10-02 ENCOUNTER — Ambulatory Visit (INDEPENDENT_AMBULATORY_CARE_PROVIDER_SITE_OTHER): Payer: Medicare HMO | Admitting: Family Medicine

## 2021-10-02 ENCOUNTER — Encounter: Payer: Self-pay | Admitting: Family Medicine

## 2021-10-02 VITALS — BP 139/86 | HR 63 | Temp 98.1°F | Ht 71.0 in | Wt 178.6 lb

## 2021-10-02 DIAGNOSIS — M542 Cervicalgia: Secondary | ICD-10-CM | POA: Diagnosis not present

## 2021-10-02 DIAGNOSIS — M2578 Osteophyte, vertebrae: Secondary | ICD-10-CM | POA: Diagnosis not present

## 2021-10-02 DIAGNOSIS — M546 Pain in thoracic spine: Secondary | ICD-10-CM

## 2021-10-02 MED ORDER — HYDROCODONE-ACETAMINOPHEN 5-325 MG PO TABS: 1.0000 | ORAL_TABLET | Freq: Four times a day (QID) | ORAL | 0 refills | Status: DC | PRN

## 2021-10-02 MED ORDER — PREDNISONE 10 MG PO TABS: ORAL_TABLET | ORAL | 0 refills | Status: DC

## 2021-10-02 MED ORDER — TIZANIDINE HCL 4 MG PO TABS: 4.0000 mg | ORAL_TABLET | Freq: Four times a day (QID) | ORAL | 1 refills | Status: AC | PRN
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
## 2021-10-14 ENCOUNTER — Telehealth: Payer: Self-pay | Admitting: Family Medicine

## 2021-10-14 NOTE — Telephone Encounter (Signed)
°  Left message for patient to call back and schedule Medicare Annual Wellness Visit (AWV) to be completed by video or phone.  No hx of AWV eligible for AWVI as of 03/15/2019  Please schedule at anytime with [facility] Nurse Health Advisor --- Germaine Pomfret  45 Minutes appointment   Any questions, please call me at [phone]

## 2021-10-22 ENCOUNTER — Ambulatory Visit: Payer: Medicare HMO | Admitting: Family Medicine

## 2021-10-24 ENCOUNTER — Ambulatory Visit: Payer: Medicare HMO | Admitting: Family Medicine

## 2021-10-28 ENCOUNTER — Other Ambulatory Visit: Payer: Self-pay | Admitting: Nurse Practitioner

## 2021-10-28 DIAGNOSIS — N529 Male erectile dysfunction, unspecified: Secondary | ICD-10-CM

## 2021-11-03 ENCOUNTER — Encounter: Payer: Self-pay | Admitting: Family Medicine

## 2021-11-03 ENCOUNTER — Ambulatory Visit (INDEPENDENT_AMBULATORY_CARE_PROVIDER_SITE_OTHER): Payer: Medicare HMO | Admitting: Family Medicine

## 2021-11-03 VITALS — BP 137/87 | HR 67 | Temp 97.7°F | Ht 71.0 in | Wt 180.4 lb

## 2021-11-03 DIAGNOSIS — M5030 Other cervical disc degeneration, unspecified cervical region: Secondary | ICD-10-CM | POA: Diagnosis not present

## 2021-11-03 DIAGNOSIS — M542 Cervicalgia: Secondary | ICD-10-CM

## 2021-11-03 DIAGNOSIS — M546 Pain in thoracic spine: Secondary | ICD-10-CM

## 2021-11-03 DIAGNOSIS — M50321 Other cervical disc degeneration at C4-C5 level: Secondary | ICD-10-CM

## 2021-11-03 MED ORDER — NABUMETONE 500 MG PO TABS: 1000.0000 mg | ORAL_TABLET | Freq: Two times a day (BID) | ORAL | 1 refills | Status: DC

## 2021-11-03 NOTE — Progress Notes (Signed)
Subjective:  Patient ID: Jerry Gill, male    DOB: 01/14/1961  Age: 61 y.o. MRN: [id number]  CC: Follow-up (Back pain)   HPI Jerry Gill presents for back pain. Much better. Still sore some times. Pain at shoulders, neck and a bit in the upper back. Hasn't regained full ROM at the neck.   Depression screen [facility] 2/9 11/03/2021 10/02/2021 07/30/2021  Decreased Interest 0 0 0  Down, Depressed, Hopeless 0 0 0  PHQ - 2 Score 0 0 0    History Edd has a past medical history of AAA (abdominal aortic aneurysm), Collagen vascular disease ([facility]), DDD (degenerative disc disease), lumbar, and Hypertension.   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   His family history includes AAA (abdominal aortic aneurysm) in his father; Cancer in his father; Heart disease in his father and mother; Hyperlipidemia in his brother; Hypertension in his mother.He reports that he has been smoking cigarettes. He has a 23.50 pack-year smoking history. He has never used smokeless tobacco. He reports that he does not drink alcohol and does not use drugs.    ROS Review of Systems  Constitutional:  Negative for fever.  HENT:  Negative for sore throat.   Respiratory:  Negative for shortness of breath.   Cardiovascular:  Negative for chest pain.  Gastrointestinal:  Negative for abdominal pain.  Musculoskeletal:  Positive for arthralgias, back pain (between shoulder blades. Aches intermittently), myalgias and neck pain.  Skin:  Negative for rash.  Neurological:  Positive for weakness. Negative for numbness.   Objective:  BP 137/87    Pulse 67    Temp 97.7 F (36.5 C)    Ht 5\' 11"  (1.803 m)    Wt 180 lb 6.4 oz (81.8 kg)    SpO2 97%    BMI 25.16 kg/m   BP Readings from Last 3 Encounters:  11/03/21 137/87  10/02/21 139/86  07/30/21 (!) 148/85    Wt Readings from Last 3 Encounters:  11/03/21 180 lb 6.4  oz (81.8 kg)  10/02/21 178 lb 9.6 oz (81 kg)  07/30/21 178 lb (80.7 kg)     Physical Exam Vitals reviewed.  Constitutional:      General: He is not in acute distress.    Appearance: Normal appearance. He is well-developed.  HENT:     Head: Normocephalic and atraumatic.     Right Ear: External ear normal.     Left Ear: External ear normal.     Mouth/Throat:     Pharynx: No oropharyngeal exudate or posterior oropharyngeal erythema.  Eyes:     Pupils: Pupils are equal, round, and reactive to light.  Cardiovascular:     Rate and Rhythm: Normal rate and regular rhythm.     Heart sounds: Normal heart sounds. No murmur heard. Pulmonary:     Effort: Pulmonary effort is normal. No respiratory distress.     Breath sounds: Normal breath sounds.  Abdominal:     Tenderness: There is no abdominal tenderness.  Musculoskeletal:        General: Tenderness (superior tapezius, rhomboids and bilateral cervicalis) present.     Cervical back: Normal range of motion and neck supple.  Skin:    General: Skin is warm and dry.  Neurological:     Mental Status: He is alert and oriented to person, place, and time.     Deep Tendon Reflexes: Reflexes are normal and symmetric.  Psychiatric:  Behavior: Behavior normal.        Thought Content: Thought content normal.      Assessment & Plan:   Jerry Gill was seen today for follow-up.  Diagnoses and all orders for this visit:  DDD (degenerative disc disease), cervical -     Ambulatory referral to Physical Therapy  Acute bilateral thoracic back pain -     Ambulatory referral to Physical Therapy  Cervicalgia -     Ambulatory referral to Physical Therapy  Other orders -     nabumetone (RELAFEN) 500 MG tablet; Take 2 tablets (1,000 mg total) by mouth 2 (two) times daily. For muscle and joint pain       I have discontinued Victormanuel Lesnick's predniSONE and HYDROcodone-acetaminophen. I am also having him start on nabumetone. Additionally, I am  having him maintain his aspirin EC, Multiple Vitamins-Minerals (MULTIVITAMIN GUMMIES MENS PO), fluticasone, clobetasol, albuterol, metoprolol succinate, omeprazole, rosuvastatin, tiZANidine, and sildenafil.  Allergies as of 11/03/2021       Reactions   Codeine Nausea And Vomiting   Penicillins Itching, Rash   Has patient had a PCN reaction causing immediate rash, facial/tongue/throat swelling, SOB or lightheadedness with hypotension: Unknown, childhood reaction Has patient had a PCN reaction causing severe rash involving mucus membranes or skin necrosis: No Has patient had a PCN reaction that required hospitalization No Has patient had a PCN reaction occurring within the last 10 years: No If all of the above answers are "NO", then may proceed with Cephalosporin use.   Sulfa Antibiotics Itching        Medication List        Accurate as of November 03, 2021  1:43 PM. If you have any questions, ask your nurse or doctor.          STOP taking these medications    HYDROcodone-acetaminophen 5-325 MG tablet Commonly known as: Norco Stopped by: Mechele Claude, MD   predniSONE 10 MG tablet Commonly known as: DELTASONE Stopped by: Mechele Claude, MD       TAKE these medications    albuterol 108 (90 Base) MCG/ACT inhaler Commonly known as: VENTOLIN HFA Inhale 2 puffs into the lungs every 4 (four) hours as needed.   aspirin EC 81 MG tablet Take 81 mg by mouth every evening.   clobetasol 0.05 % external solution Commonly known as: TEMOVATE Apply 1 application topically 2 (two) times daily.   fluticasone 50 MCG/ACT nasal spray Commonly known as: FLONASE Place 1 spray into both nostrils daily as needed for allergies or rhinitis. (Needs to be seen before next refill)   metoprolol succinate 100 MG 24 hr tablet Commonly known as: TOPROL-XL TAKE TWO TABLETS BY MOUTH DAILY   MULTIVITAMIN GUMMIES MENS PO Take 1 tablet by mouth 2 (two) times daily.   nabumetone 500 MG  tablet Commonly known as: RELAFEN Take 2 tablets (1,000 mg total) by mouth 2 (two) times daily. For muscle and joint pain Started by: Mechele Claude, MD   omeprazole 20 MG capsule Commonly known as: PRILOSEC Take 1 capsule (20 mg total) by mouth daily. For reflux   rosuvastatin 5 MG tablet Commonly known as: Crestor Take 1 tablet (5 mg total) by mouth daily. For cholesterol   sildenafil 20 MG tablet Commonly known as: REVATIO TAKE 2-5 TABLETS AS NEEDED PRIOR TO SEXUAL ACTIVITY   tiZANidine 4 MG tablet Commonly known as: ZANAFLEX Take 1 tablet (4 mg total) by mouth every 6 (six) hours as needed for muscle spasms.  Follow-up: No follow-ups on file.  Mechele Claude, M.D.

## 2021-11-17 ENCOUNTER — Ambulatory Visit (INDEPENDENT_AMBULATORY_CARE_PROVIDER_SITE_OTHER): Payer: Medicare HMO

## 2021-11-17 ENCOUNTER — Telehealth: Payer: Self-pay

## 2021-11-17 ENCOUNTER — Other Ambulatory Visit: Payer: Self-pay | Admitting: Family Medicine

## 2021-11-17 VITALS — Ht 71.0 in | Wt 178.0 lb

## 2021-11-17 DIAGNOSIS — Z1212 Encounter for screening for malignant neoplasm of rectum: Secondary | ICD-10-CM

## 2021-11-17 DIAGNOSIS — I10 Essential (primary) hypertension: Secondary | ICD-10-CM

## 2021-11-17 DIAGNOSIS — Z0000 Encounter for general adult medical examination without abnormal findings: Secondary | ICD-10-CM

## 2021-11-17 DIAGNOSIS — Z720 Tobacco use: Secondary | ICD-10-CM

## 2021-11-17 DIAGNOSIS — Z1211 Encounter for screening for malignant neoplasm of colon: Secondary | ICD-10-CM | POA: Diagnosis not present

## 2021-11-17 MED ORDER — METOPROLOL SUCCINATE ER 200 MG PO TB24: 200.0000 mg | ORAL_TABLET | Freq: Every day | ORAL | 3 refills | Status: DC

## 2021-11-17 MED ORDER — METOPROLOL SUCCINATE ER 100 MG PO TB24: 100.0000 mg | ORAL_TABLET | Freq: Every day | ORAL | 3 refills | Status: DC

## 2021-11-17 NOTE — Progress Notes (Signed)
Subjective:   Jerry Gill is a 61 y.o. male who presents for an Initial Medicare Annual Wellness Visit. Virtual Visit via Telephone Note  I connected with  Jerry Gill on 11/17/21 at  9:00 AM EST by telephone and verified that I am speaking with the correct person using two identifiers.  Location: Patient: HOME Provider: [facility] Persons participating in the virtual visit: patient/Nurse Health Advisor   I discussed the limitations, risks, security and privacy concerns of performing an evaluation and management service by telephone and the availability of in person appointments. The patient expressed understanding and agreed to proceed.  Interactive audio and video telecommunications were attempted between this nurse and patient, however failed, due to patient having technical difficulties OR patient did not have access to video capability.  We continued and completed visit with audio only.  Some vital signs may be absent or patient reported.   Jerry Driver, LPN  Review of Systems     Cardiac Risk Factors include: advanced age (>93mn, >>66women);hypertension;dyslipidemia;male gender;sedentary lifestyle;smoking/ tobacco exposure     Objective:    Today's Vitals   11/17/21 0907  Weight: 178 lb (80.7 kg)  Height: '5\' 11"'  (1.803 m)   Body mass index is 24.83 kg/m.  Advanced Directives 11/17/2021 07/25/2017 04/05/2016 01/21/2016 12/20/2015 12/16/2015 11/28/2015  Does Patient Have a Medical Advance Directive? No No No No No No No  Would patient like information on creating a medical advance directive? No - Patient declined - - - No - patient declined information No - patient declined information No - patient declined information    Current Medications (verified) Outpatient Encounter Medications as of 11/17/2021  Medication Sig   albuterol (VENTOLIN HFA) 108 (90 Base) MCG/ACT inhaler Inhale 2 puffs into the lungs every 4 (four) hours as needed.   aspirin EC 81 MG tablet Take 81 mg by  mouth every evening.   clobetasol (TEMOVATE) 0.05 % external solution Apply 1 application topically 2 (two) times daily.   fluticasone (FLONASE) 50 MCG/ACT nasal spray Place 1 spray into both nostrils daily as needed for allergies or rhinitis. (Needs to be seen before next refill)   metoprolol succinate (TOPROL-XL) 100 MG 24 hr tablet TAKE TWO TABLETS BY MOUTH DAILY   Multiple Vitamins-Minerals (MULTIVITAMIN GUMMIES MENS PO) Take 1 tablet by mouth 2 (two) times daily.   nabumetone (RELAFEN) 500 MG tablet Take 2 tablets (1,000 mg total) by mouth 2 (two) times daily. For muscle and joint pain   omeprazole (PRILOSEC) 20 MG capsule Take 1 capsule (20 mg total) by mouth daily. For reflux   rosuvastatin (CRESTOR) 5 MG tablet Take 1 tablet (5 mg total) by mouth daily. For cholesterol   sildenafil (REVATIO) 20 MG tablet TAKE 2-5 TABLETS AS NEEDED PRIOR TO SEXUAL ACTIVITY   tiZANidine (ZANAFLEX) 4 MG tablet Take 1 tablet (4 mg total) by mouth every 6 (six) hours as needed for muscle spasms.   No facility-administered encounter medications on file as of 11/17/2021.    Allergies (verified) Codeine, Penicillins, and Sulfa antibiotics   History: Past Medical History:  Diagnosis Date   AAA (abdominal aortic aneurysm)    Collagen vascular disease (HCC)    DDD (degenerative disc disease), lumbar    Hypertension    Past Surgical History:  Procedure Laterality Date   ABDOMINAL AORTIC ENDOVASCULAR STENT GRAFT N/A 12/20/2015   Procedure: ABDOMINAL AORTIC ENDOVASCULAR STENT GRAFT;  Surgeon: Jerry Posner MD;  [facility] [facility]  Service: Vascular;  Laterality: N/A;  hemmrroidectomy     UMBILICAL HERNIA REPAIR     as infant     Family History  Problem Relation Age of Onset   Heart disease Father        before age 87   AAA (abdominal aortic aneurysm) Father    Cancer Father    Heart disease Mother        before age 62   Hypertension Mother    Hyperlipidemia Brother    Social History   Socioeconomic  History   Marital status: Married    Spouse name: Jerry Gill   Number of children: 7   Years of education: 8th   Highest education level: Not on file  Occupational History   Occupation: truck Gill  Tobacco Use   Smoking status: Some Days    Packs/day: 0.50    Years: 47.00    Pack years: 23.50    Types: Cigarettes   Smokeless tobacco: Never  Vaping Use   Vaping Use: Never used  Substance and Sexual Activity   Alcohol use: No    Alcohol/week: 0.0 standard drinks   Drug use: No   Sexual activity: Not on file  Other Topics Concern   Not on file  Social History Narrative   epworth sleepiness scale - 9 (12/13/2015)   Lives with wife Jerry Gill.   Social Determinants of Health   Financial Resource Strain: Low Risk    Difficulty of Paying Living Expenses: Not hard at all  Food Insecurity: No Food Insecurity   Worried About Charity fundraiser in the Last Year: Never true   [place] in the Last Year: Never true  Transportation Needs: No Transportation Needs   Lack of Transportation (Medical): No   Lack of Transportation (Non-Medical): No  Physical Activity: Insufficiently Active   Days of Exercise per Week: 5 days   Minutes of Exercise per Session: 20 min  Stress: No Stress Concern Present   Feeling of Stress : Not at all  Social Connections: Socially Integrated   Frequency of Communication with Friends and Family: More than three times a week   Frequency of Social Gatherings with Friends and Family: More than three times a week   Attends Religious Services: 1 to 4 times per year   Active Member of [employer] or Organizations: No   Attends Music therapist: 1 to 4 times per year   Marital Status: Married    Tobacco Counseling Ready to quit: Not Answered Counseling given: Not Answered   Clinical Intake:  Pre-visit preparation completed: Yes  Pain : No/denies pain     BMI - recorded: 24.83 Nutritional Status: BMI of 19-24  Normal Nutritional Risks:  None Diabetes: No  How often do you need to have someone help you when you read instructions, pamphlets, or other written materials from your doctor or pharmacy?: 1 - Never  Diabetic?No  Interpreter Needed?: No  Information entered by :: mj Delano Scardino, lpn   Activities of Daily Living In your present state of health, do you have any difficulty performing the following activities: 11/17/2021  Hearing? N  Vision? N  Difficulty concentrating or making decisions? N  Walking or climbing stairs? N  Dressing or bathing? N  Doing errands, shopping? N  Preparing Food and eating ? N  Using the Toilet? N  In the past six months, have you accidently leaked urine? N  Do you have problems with loss of bowel control? N  Managing your Medications? N  Managing  your Finances? N  Housekeeping or managing your Housekeeping? N  Some recent data might be hidden    Patient Care Team: Claretta Fraise, MD as PCP - General (Family Medicine)  Indicate any recent Medical Services you may have received from other than [facility] providers in the past year (date may be approximate).     Assessment:   This is a routine wellness examination for [facility].  Hearing/Vision screen Hearing Screening - Comments:: No hearing issues.  Vision Screening - Comments:: Glasses. [facility] [place]. 2022.  Dietary issues and exercise activities discussed: Current Exercise Habits: Home exercise routine, Type of exercise: walking, Time (Minutes): 20, Frequency (Times/Week): 5, Weekly Exercise (Minutes/Week): 100, Intensity: Mild, Exercise limited by: orthopedic condition(s);cardiac condition(s)   Goals Addressed             This Visit's Progress    Quit Smoking       Try to decrease smoking or stop       Depression Screen PHQ 2/9 Scores 11/17/2021 11/03/2021 10/02/2021 07/30/2021 10/21/2020 09/18/2020 04/29/2020  PHQ - 2 Score 0 0 0 0 0 0 0    Fall Risk Fall Risk  11/17/2021 11/03/2021 10/02/2021 07/30/2021 10/21/2020   Falls in the past year? 0 0 0 0 0  Number falls in past yr: 0 - - - -  Injury with Fall? 0 - - - -  Risk for fall due to : No Fall Risks - - - No Fall Risks  Follow up Falls prevention discussed - - - Falls evaluation completed    FALL RISK PREVENTION PERTAINING TO THE HOME:  Any stairs in or around the home? Yes  If so, are there any without handrails? No  Home free of loose throw rugs in walkways, pet beds, electrical cords, etc? Yes  Adequate lighting in your home to reduce risk of falls? Yes   ASSISTIVE DEVICES UTILIZED TO PREVENT FALLS:  Life alert? No  Use of a cane, walker or w/c? No  Grab bars in the bathroom? No  Shower chair or bench in shower? No  Elevated toilet seat or a handicapped toilet? No   TIMED UP AND GO:  Was the test performed? No .  Phone visit.   Cognitive Function:     6CIT Screen 11/17/2021  What Year? 0 points  What month? 0 points  What time? 0 points  Count back from 20 0 points  Months in reverse 0 points  Repeat phrase 0 points  Total Score 0    Immunizations Immunization History  Administered Date(s) Administered   Tdap 01/04/2015    TDAP status: Up to date  Flu Vaccine status: Declined, Education has been provided regarding the importance of this vaccine but patient still declined. Advised may receive this vaccine at local pharmacy or Health Dept. Aware to provide a copy of the vaccination record if obtained from local pharmacy or Health Dept. Verbalized acceptance and understanding.  Pneumococcal vaccine status: Due, Education has been provided regarding the importance of this vaccine. Advised may receive this vaccine at local pharmacy or Health Dept. Aware to provide a copy of the vaccination record if obtained from local pharmacy or Health Dept. Verbalized acceptance and understanding.  Covid-19 vaccine status: Declined, Education has been provided regarding the importance of this vaccine but patient still declined. Advised may  receive this vaccine at local pharmacy or Health Dept.or vaccine clinic. Aware to provide a copy of the vaccination record if obtained from local pharmacy or Health Dept. Verbalized  acceptance and understanding.  Qualifies for Shingles Vaccine? Yes   Zostavax completed No   Shingrix Completed?: No.    Education has been provided regarding the importance of this vaccine. Patient has been advised to call insurance company to determine out of pocket expense if they have not yet received this vaccine. Advised may also receive vaccine at local pharmacy or Health Dept. Verbalized acceptance and understanding.  Screening Tests Health Maintenance  Topic Date Due   COVID-19 Vaccine (1) Never done   COLONOSCOPY (Pts 45-108yr Insurance coverage will need to be confirmed)  Never done   Zoster Vaccines- Shingrix (1 of 2) Never done   INFLUENZA VACCINE  12/12/2021 (Originally 04/14/2021)   Hepatitis C Screening  07/30/2022 (Originally [birth year]14/1977)   HIV Screening  07/30/2022 (Originally 04/14/1974)   TETANUS/TDAP  01/03/2025   HPV VACCINES  Aged Out    Health Maintenance  Health Maintenance Due  Topic Date Due   COVID-19 Vaccine (1) Never done   COLONOSCOPY (Pts 45-440yrInsurance coverage will need to be confirmed)  Never done   Zoster Vaccines- Shingrix (1 of 2) Never done    Colorectal cancer screening: Referral to GI placed Cologuard order placed. Pt aware the office will call re: appt.  Lung Cancer Screening: (Low Dose CT Chest recommended if Age 583-80ears, 30 pack-year currently smoking OR have quit w/in 15years.) does qualify.   Lung Cancer Screening Referral: Order placed for Lung Cancer Screening.   Additional Screening:  Hepatitis C Screening: does qualify; Completed DUE  Vision Screening: Recommended annual ophthalmology exams for early detection of glaucoma and other disorders of the eye. Is the patient up to date with their annual eye exam?  Yes  Who is the provider or what is the  name of the office in which the patient attends annual eye exams[place] [facility] [place] not established with a provider, would they like to be referred to a provider to establish care? No .   Dental Screening: Recommended annual dental exams for proper oral hygiene  Community Resource Referral / Chronic Care Management: CRR required this visit?  No   CCM required this visit?  No      Plan:     I have personally reviewed and noted the following in the patients chart:   Medical and social history Use of alcohol, tobacco or illicit drugs  Current medications and supplements including opioid prescriptions. Patient is not currently taking opioid prescriptions. Functional ability and status Nutritional status Physical activity Advanced directives List of other physicians Hospitalizations, surgeries, and ER visits in previous 12 months Vitals Screenings to include cognitive, depression, and falls Referrals and appointments  In addition, I have reviewed and discussed with patient certain preventive protocols, quality metrics, and best practice recommendations. A written personalized care plan for preventive services as well as general preventive health recommendations were provided to patient.     MaChriss DriverLPN   3/0/01/3975 Nurse Notes: Discussed Cologuard and pt is willing to do kit. Order placed. Discussed lung ca screening and pt is willing to do. Order placed. Declines all vaccines.

## 2021-11-17 NOTE — Patient Instructions (Signed)
Jerry Gill , Thank you for taking time to come for your Medicare Wellness Visit. I appreciate your ongoing commitment to your health goals. Please review the following plan we discussed and let me know if I can assist you in the future.   Screening recommendations/referrals: Colonoscopy: Order for Cologuard placed today.  Recommended yearly ophthalmology/optometry visit for glaucoma screening and checkup Recommended yearly dental visit for hygiene and checkup  Vaccinations: Influenza vaccine: Declined. Pneumococcal vaccine: Due at age 61. Tdap vaccine: Done 01/04/2015 Repeat in 10 years  Shingles vaccine: Declined.   Covid-19: Declined.  Advanced directives: Advance directive discussed with you today. Even though you declined this today, please call our office should you change your mind, and we can give you the proper paperwork for you to fill out.   Conditions/risks identified: Aim for 30 minutes of exercise or brisk walking, 6-8 glasses of water, and 5 servings of fruits and vegetables each day. If you wish to quit smoking, help is available. For free tobacco cessation program offerings call the [facility] at (580) 835-3531 or Live Well Line at 224 600 5082. You may also visit www.[email or web address] or email livelifewell@ .com for more information on other programs.   You may also call 1-800-QUIT-NOW (705)276-2616) or visit www.VirusCrisis.dk or www.BecomeAnEx.org for additional resources on smoking cessation.    Next appointment: Follow up in one year for your annual wellness visit 2024.  Preventive Care 40-64 Years, Male Preventive care refers to lifestyle choices and visits with your health care provider that can promote health and wellness. What does preventive care include? A yearly physical exam. This is also called an annual well check. Dental exams once or twice a year. Routine eye exams. Ask your health care provider how often you should have your  eyes checked. Personal lifestyle choices, including: Daily care of your teeth and gums. Regular physical activity. Eating a healthy diet. Avoiding tobacco and drug use. Limiting alcohol use. Practicing safe sex. Taking low-dose aspirin every day starting at age 59. What happens during an annual well check? The services and screenings done by your health care provider during your annual well check will depend on your age, overall health, lifestyle risk factors, and family history of disease. Counseling  Your health care provider may ask you questions about your: Alcohol use. Tobacco use. Drug use. Emotional well-being. Home and relationship well-being. Sexual activity. Eating habits. Work and work Statistician. Screening  You may have the following tests or measurements: Height, weight, and BMI. Blood pressure. Lipid and cholesterol levels. These may be checked every 5 years, or more frequently if you are over 51 years old. Skin check. Lung cancer screening. You may have this screening every year starting at age 70 if you have a 30-pack-year history of smoking and currently smoke or have quit within the past 15 years. Fecal occult blood test (FOBT) of the stool. You may have this test every year starting at age 68. Flexible sigmoidoscopy or colonoscopy. You may have a sigmoidoscopy every 5 years or a colonoscopy every 10 years starting at age 27. Prostate cancer screening. Recommendations will vary depending on your family history and other risks. Hepatitis C blood test. Hepatitis B blood test. Sexually transmitted disease (STD) testing. Diabetes screening. This is done by checking your blood sugar (glucose) after you have not eaten for a while (fasting). You may have this done every 1-3 years. Discuss your test results, treatment options, and if necessary, the need for more tests with your health care  provider. Vaccines  Your health care provider may recommend certain vaccines,  such as: Influenza vaccine. This is recommended every year. Tetanus, diphtheria, and acellular pertussis (Tdap, Td) vaccine. You may need a Td booster every 10 years. Zoster vaccine. You may need this after age 44. Pneumococcal 13-valent conjugate (PCV13) vaccine. You may need this if you have certain conditions and have not been vaccinated. Pneumococcal polysaccharide (PPSV23) vaccine. You may need one or two doses if you smoke cigarettes or if you have certain conditions. Talk to your health care provider about which screenings and vaccines you need and how often you need them. This information is not intended to replace advice given to you by your health care provider. Make sure you discuss any questions you have with your health care provider. Document Released: 09/27/2015 Document Revised: 05/20/2016 Document Reviewed: 07/02/2015 Elsevier Interactive Patient Education  2017 [place] Prevention in the Home Falls can cause injuries. They can happen to people of all ages. There are many things you can do to make your home safe and to help prevent falls. What can I do on the outside of my home? Regularly fix the edges of walkways and driveways and fix any cracks. Remove anything that might make you trip as you walk through a door, such as a raised step or threshold. Trim any bushes or trees on the path to your home. Use bright outdoor lighting. Clear any walking paths of anything that might make someone trip, such as rocks or tools. Regularly check to see if handrails are loose or broken. Make sure that both sides of any steps have handrails. Any raised decks and porches should have guardrails on the edges. Have any leaves, snow, or ice cleared regularly. Use sand or salt on walking paths during winter. Clean up any spills in your garage right away. This includes oil or grease spills. What can I do in the bathroom? Use night lights. Install grab bars by the toilet and in the  tub and shower. Do not use towel bars as grab bars. Use non-skid mats or decals in the tub or shower. If you need to sit down in the shower, use a plastic, non-slip stool. Keep the floor dry. Clean up any water that spills on the floor as soon as it happens. Remove soap buildup in the tub or shower regularly. Attach bath mats securely with double-sided non-slip rug tape. Do not have throw rugs and other things on the floor that can make you trip. What can I do in the bedroom? Use night lights. Make sure that you have a light by your bed that is easy to reach. Do not use any sheets or blankets that are too big for your bed. They should not hang down onto the floor. Have a firm chair that has side arms. You can use this for support while you get dressed. Do not have throw rugs and other things on the floor that can make you trip. What can I do in the kitchen? Clean up any spills right away. Avoid walking on wet floors. Keep items that you use a lot in easy-to-reach places. If you need to reach something above you, use a strong step stool that has a grab bar. Keep electrical cords out of the way. Do not use floor polish or wax that makes floors slippery. If you must use wax, use non-skid floor wax. Do not have throw rugs and other things on the floor that can make you trip.  What can I do with my stairs? Do not leave any items on the stairs. Make sure that there are handrails on both sides of the stairs and use them. Fix handrails that are broken or loose. Make sure that handrails are as long as the stairways. Check any carpeting to make sure that it is firmly attached to the stairs. Fix any carpet that is loose or worn. Avoid having throw rugs at the top or bottom of the stairs. If you do have throw rugs, attach them to the floor with carpet tape. Make sure that you have a light switch at the top of the stairs and the bottom of the stairs. If you do not have them, ask someone to add them for  you. What else can I do to help prevent falls? Wear shoes that: Do not have high heels. Have rubber bottoms. Are comfortable and fit you well. Are closed at the toe. Do not wear sandals. If you use a stepladder: Make sure that it is fully opened. Do not climb a closed stepladder. Make sure that both sides of the stepladder are locked into place. Ask someone to hold it for you, if possible. Clearly mark and make sure that you can see: Any grab bars or handrails. First and last steps. Where the edge of each step is. Use tools that help you move around (mobility aids) if they are needed. These include: Canes. Walkers. Scooters. Crutches. Turn on the lights when you go into a dark area. Replace any light bulbs as soon as they burn out. Set up your furniture so you have a clear path. Avoid moving your furniture around. If any of your floors are uneven, fix them. If there are any pets around you, be aware of where they are. Review your medicines with your doctor. Some medicines can make you feel dizzy. This can increase your chance of falling. Ask your doctor what other things that you can do to help prevent falls. This information is not intended to replace advice given to you by your health care provider. Make sure you discuss any questions you have with your health care provider. Document Released: 06/27/2009 Document Revised: 02/06/2016 Document Reviewed: 10/05/2014 Elsevier Interactive Patient Education  2017 [employer].

## 2021-11-17 NOTE — Telephone Encounter (Signed)
The only way I could make it work to get a 90 day supply was to increase the strength of the pill to 200 mg. He should only take one a day. That way, he should be able to get a 90 day supply ?

## 2021-11-17 NOTE — Telephone Encounter (Signed)
I sent in a corrected prescription ?

## 2021-11-17 NOTE — Telephone Encounter (Signed)
Pt only takes 100mg  daily. Not 100mg  BID. He want the Rx to be 100mg  qd. Pt states that he forgets to tell you about how he takes it every time he is in the office. ?

## 2021-11-17 NOTE — Telephone Encounter (Signed)
Pt asks if a 90 day supply can be sent to his pharmacy on his Metoprolol? Pt states he is only getting a 60 day supply. Thank you.  ?

## 2021-11-17 NOTE — Addendum Note (Signed)
Addended by: Dorene Sorrow on: 11/17/2021 03:16 PM ? ? Modules accepted: Orders ? ?

## 2021-12-17 DIAGNOSIS — H5203 Hypermetropia, bilateral: Secondary | ICD-10-CM | POA: Diagnosis not present

## 2021-12-17 DIAGNOSIS — H524 Presbyopia: Secondary | ICD-10-CM | POA: Diagnosis not present

## 2021-12-17 DIAGNOSIS — H52209 Unspecified astigmatism, unspecified eye: Secondary | ICD-10-CM | POA: Diagnosis not present

## 2022-01-27 ENCOUNTER — Ambulatory Visit: Payer: Medicare HMO | Admitting: Family Medicine

## 2022-03-19 ENCOUNTER — Other Ambulatory Visit: Payer: Self-pay | Admitting: Family Medicine

## 2022-03-19 DIAGNOSIS — N529 Male erectile dysfunction, unspecified: Secondary | ICD-10-CM

## 2022-04-22 IMAGING — DX DG CERVICAL SPINE COMPLETE 4+V
5 series · 5 of 5 positions shown · non-contrast
Comparison: None.

CLINICAL DATA: Mid back pain.

EXAM:
CERVICAL SPINE - COMPLETE 4+ VIEW

[c-spine lat]
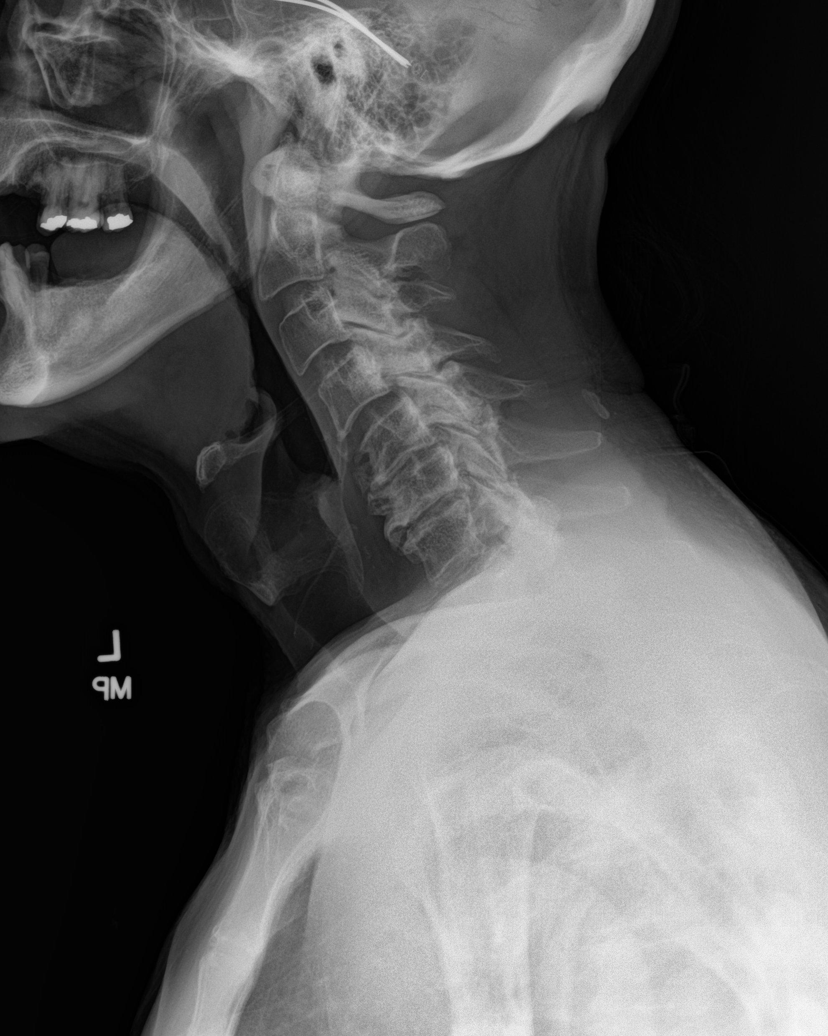

[c-spine obl (1 of 2)]
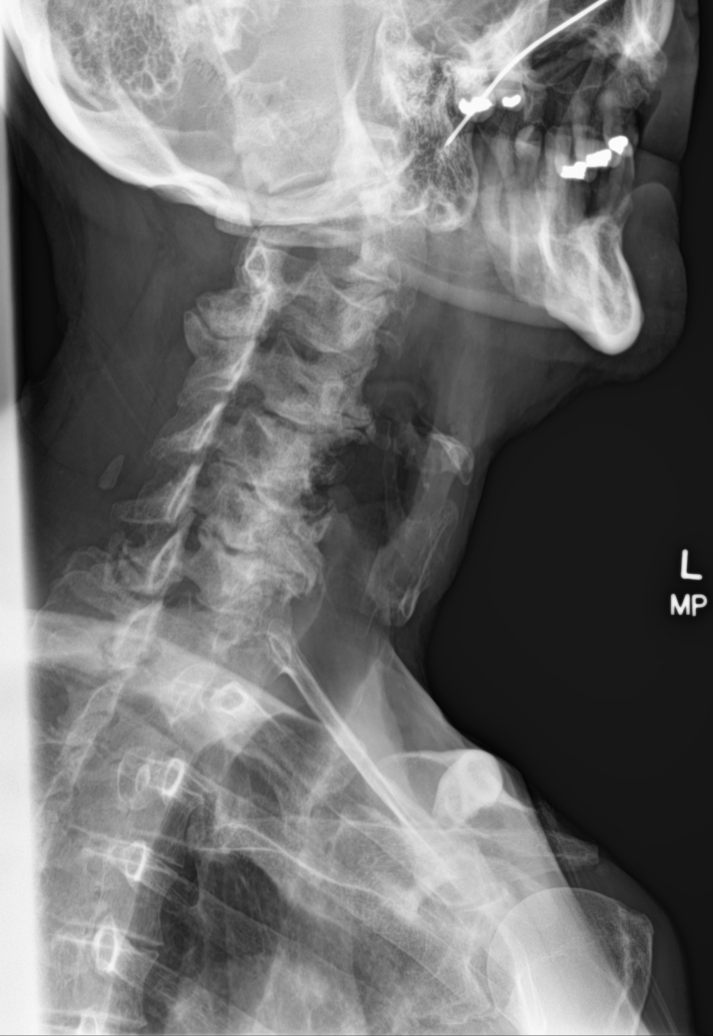

[c-spine obl (2 of 2)]
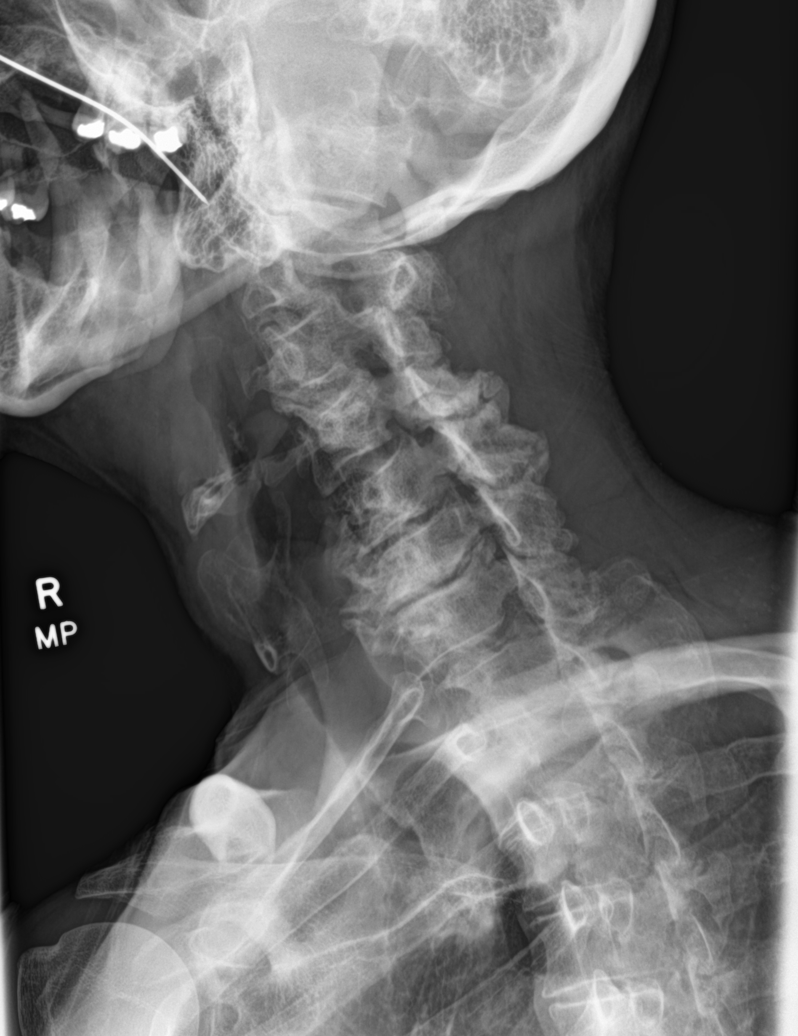

[c-spine ap]
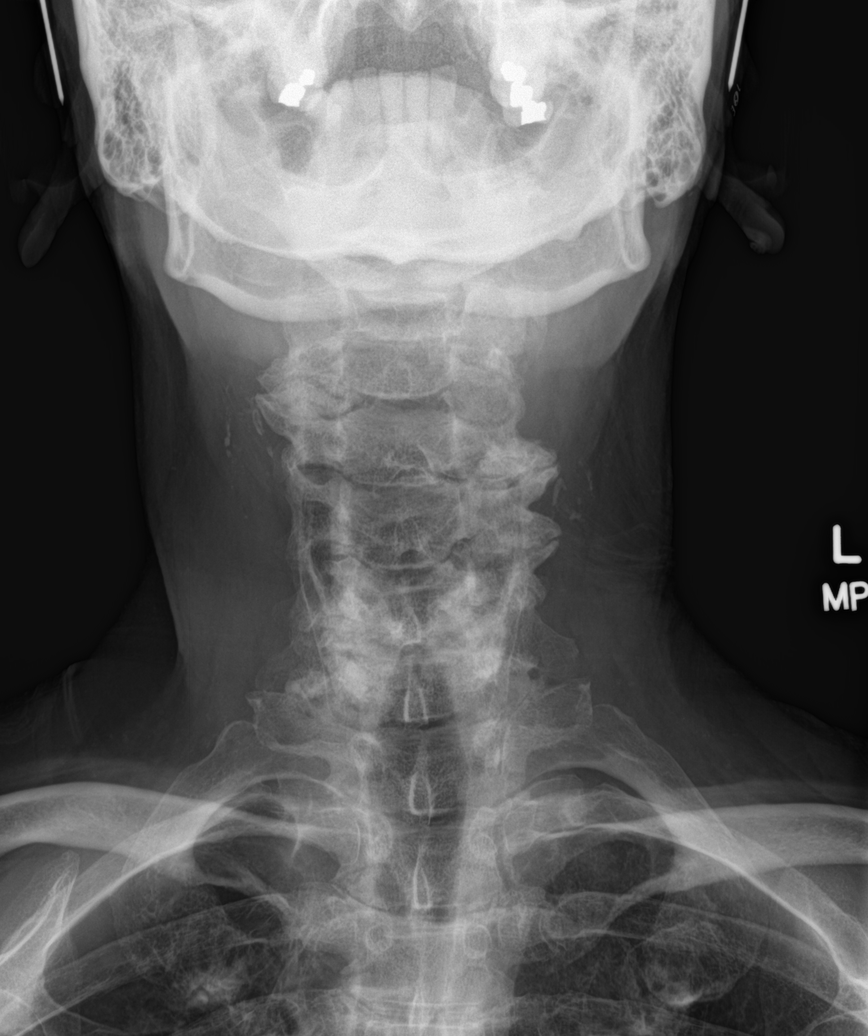

[c-spine open mouth]
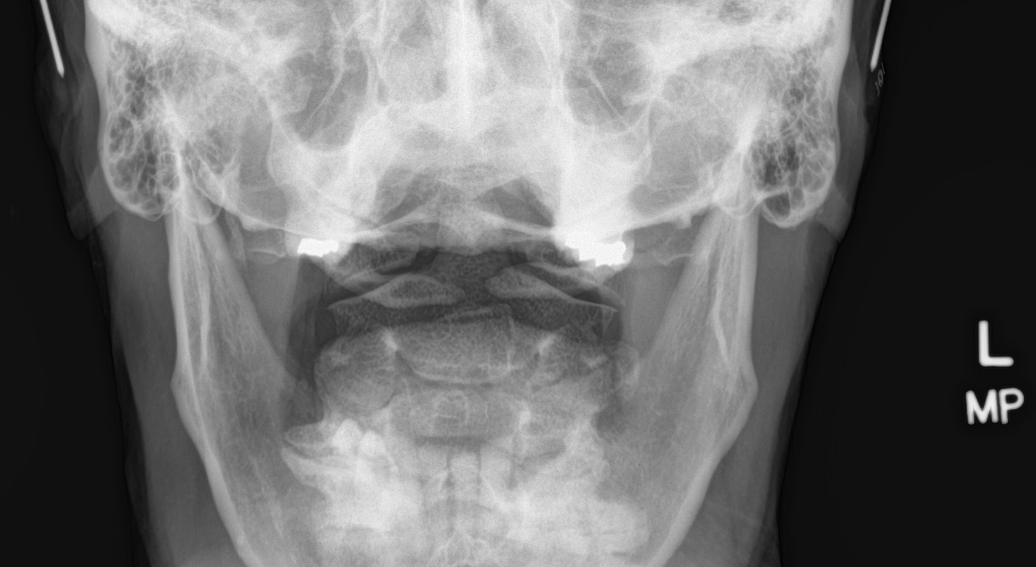

[5 of 5 positions shown; findings below may reference images not displayed]

FINDINGS: 3.7 mm anterolisthesis of C4 versus C5. Reversal of normal lordosis
centered at C5-6. Multilevel degenerative changes most marked at
C5-6 and C6-7. Facet degenerative changes. No acute fractures. The
pre odontoid space and prevertebral soft tissues are normal.
Posterior osteophytes are seen at several levels with mild narrowing
of several lower bilateral neural foramina. No other abnormalities.
IMPRESSION: 1. 3.7 mm anterolisthesis of C4 versus C5 and reversal of normal
lordosis centered at C5-6.
2. Multilevel degenerative disc disease and facet degenerative
changes as above.
3. Posterior osteophytes at several levels with mild narrowing of
several lower bilateral neural foramina. Cross-sectional imaging
could better evaluate.

## 2022-04-22 IMAGING — DX DG THORACIC SPINE 2V
3 series · 3 of 3 positions shown · non-contrast
Comparison: None.

CLINICAL DATA: Mid back pain.

EXAM:
THORACIC SPINE 2 VIEWS

[t-spine ap]
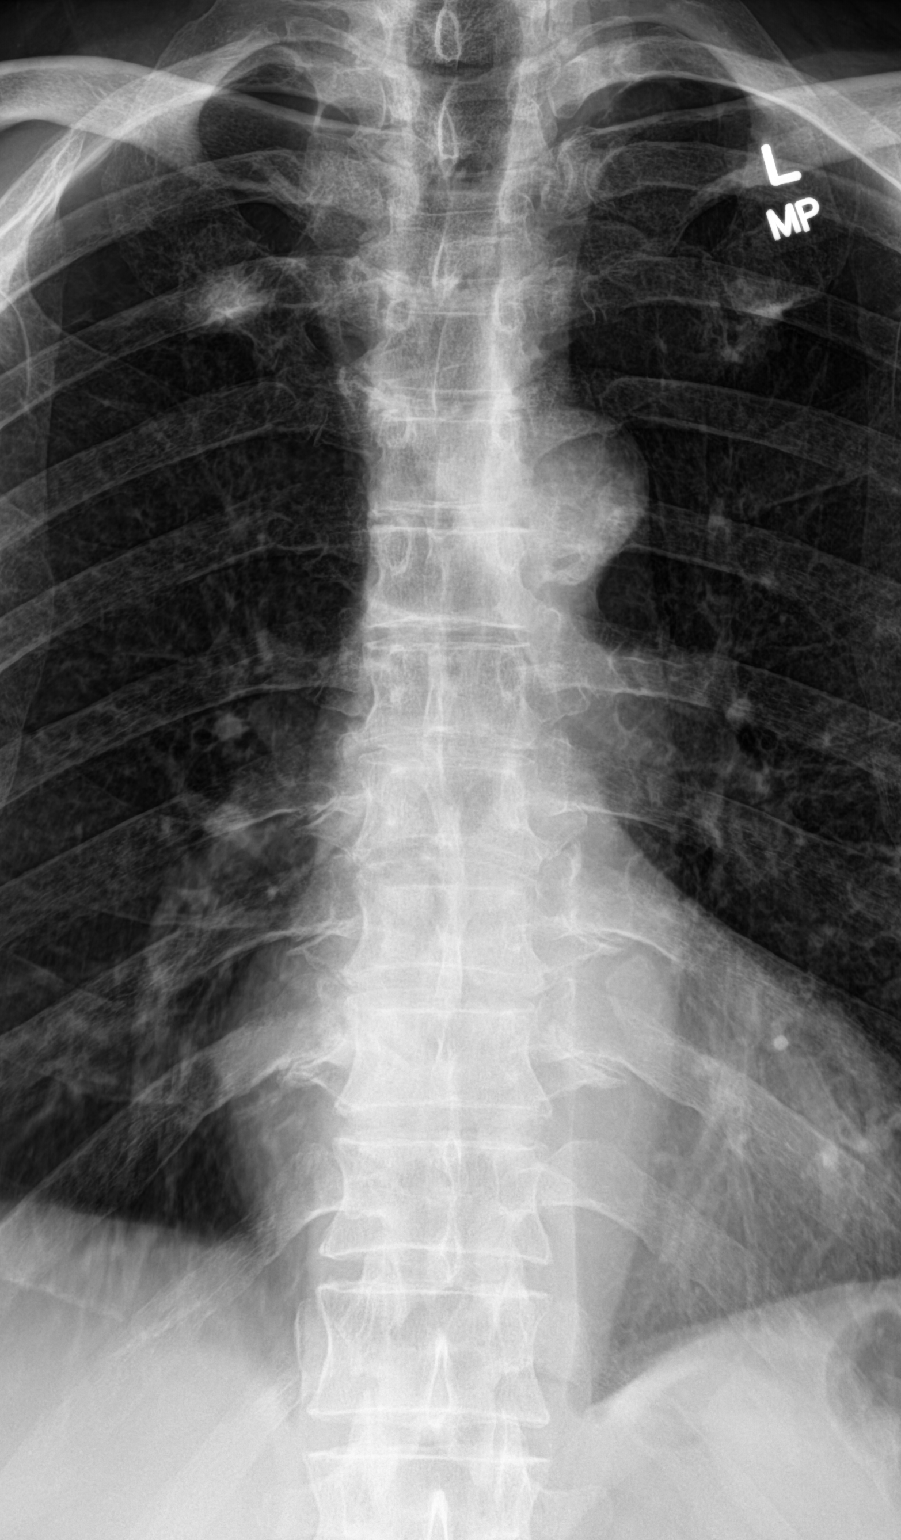

[t-spine lat]
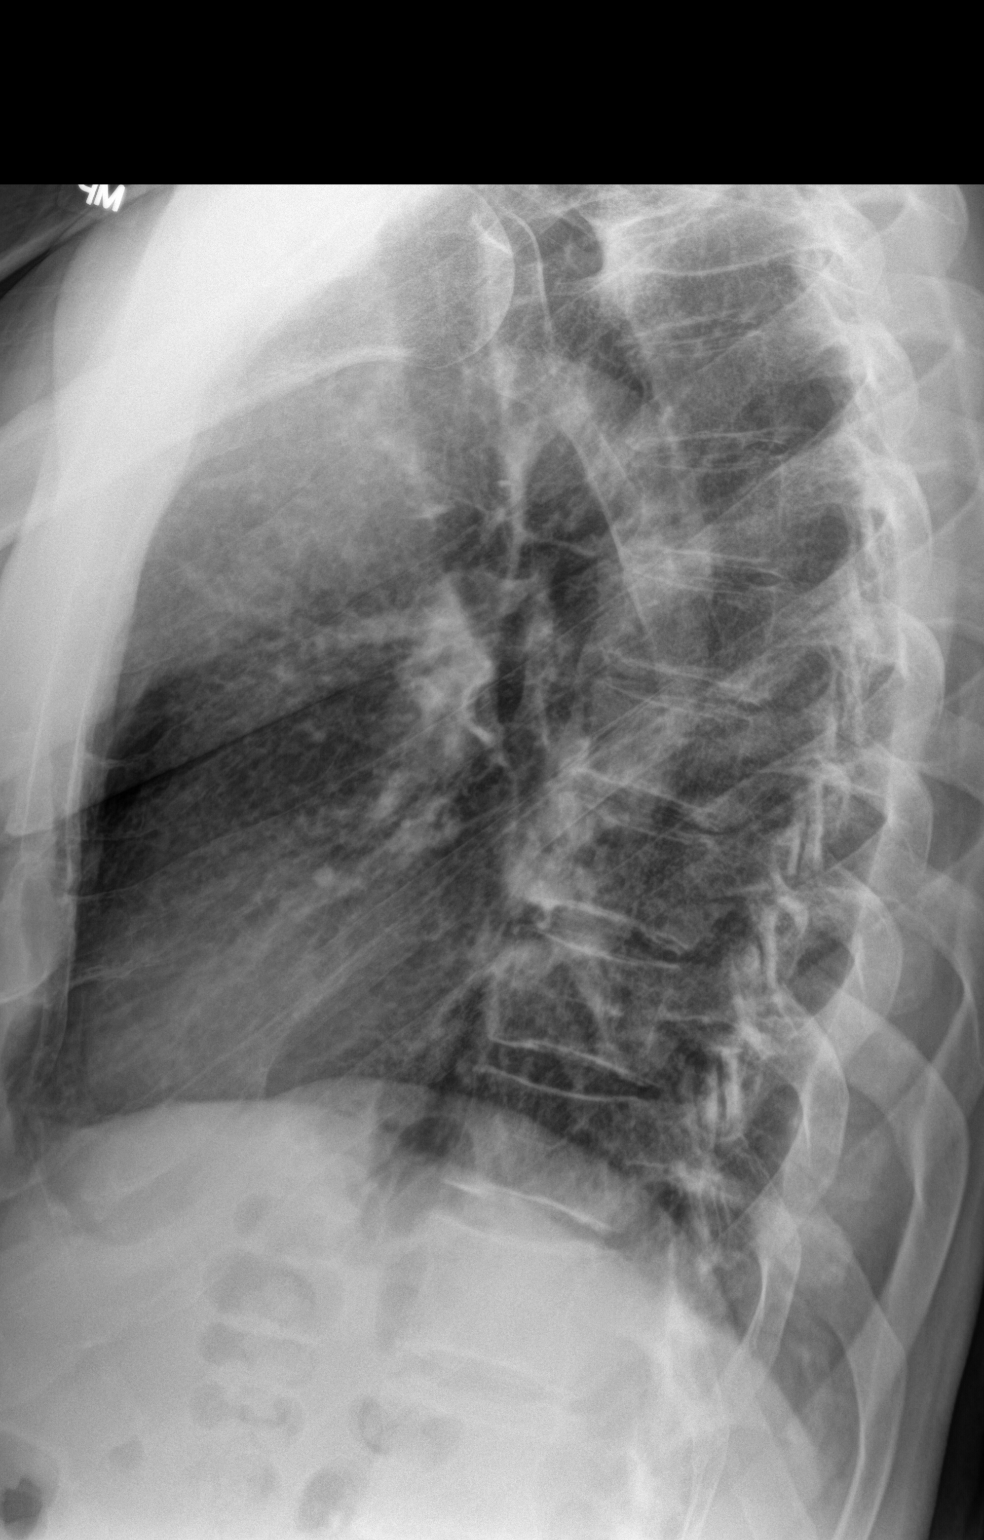

[t-spine lat swimmers]
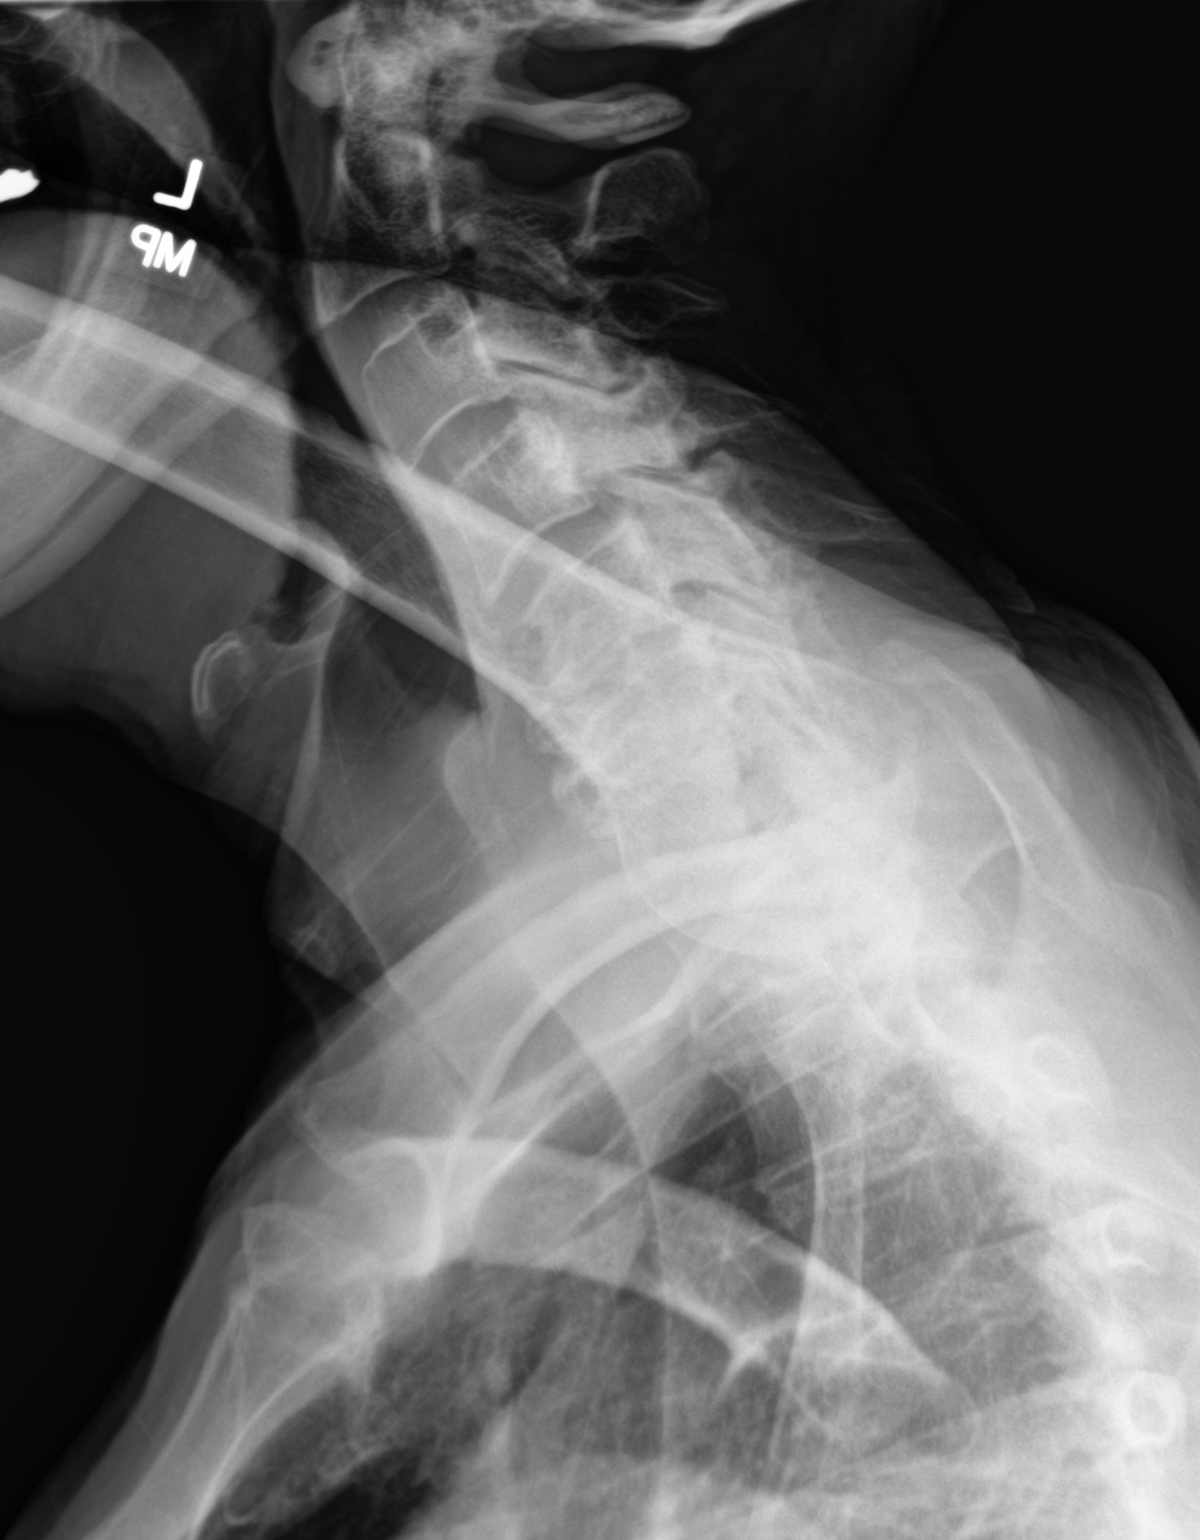

[3 of 3 positions shown; findings below may reference images not displayed]

FINDINGS: There is no evidence of thoracic spine fracture. Alignment is
normal. No other significant bone abnormalities are identified.
IMPRESSION: Negative.

## 2022-07-26 DIAGNOSIS — J40 Bronchitis, not specified as acute or chronic: Secondary | ICD-10-CM | POA: Diagnosis not present

## 2022-07-26 DIAGNOSIS — I10 Essential (primary) hypertension: Secondary | ICD-10-CM | POA: Diagnosis not present

## 2022-07-26 DIAGNOSIS — R0981 Nasal congestion: Secondary | ICD-10-CM | POA: Diagnosis not present

## 2022-07-26 DIAGNOSIS — Z1152 Encounter for screening for COVID-19: Secondary | ICD-10-CM | POA: Diagnosis not present

## 2022-07-26 DIAGNOSIS — Z720 Tobacco use: Secondary | ICD-10-CM | POA: Diagnosis not present

## 2022-07-26 DIAGNOSIS — M791 Myalgia, unspecified site: Secondary | ICD-10-CM | POA: Diagnosis not present

## 2022-07-26 DIAGNOSIS — R059 Cough, unspecified: Secondary | ICD-10-CM | POA: Diagnosis not present

## 2022-07-31 ENCOUNTER — Ambulatory Visit (INDEPENDENT_AMBULATORY_CARE_PROVIDER_SITE_OTHER): Payer: Medicare HMO | Admitting: Nurse Practitioner

## 2022-07-31 ENCOUNTER — Encounter: Payer: Self-pay | Admitting: Nurse Practitioner

## 2022-07-31 VITALS — BP 164/95 | HR 67 | Temp 98.4°F | Ht 71.0 in | Wt 184.2 lb

## 2022-07-31 DIAGNOSIS — J40 Bronchitis, not specified as acute or chronic: Secondary | ICD-10-CM

## 2022-07-31 MED ORDER — BENZONATATE 100 MG PO CAPS: 200.0000 mg | ORAL_CAPSULE | Freq: Three times a day (TID) | ORAL | 0 refills | Status: DC | PRN

## 2022-07-31 MED ORDER — ALBUTEROL SULFATE HFA 108 (90 BASE) MCG/ACT IN AERS: 2.0000 | INHALATION_SPRAY | RESPIRATORY_TRACT | 1 refills | Status: DC | PRN

## 2022-07-31 MED ORDER — GUAIFENESIN ER 600 MG PO TB12: 600.0000 mg | ORAL_TABLET | Freq: Two times a day (BID) | ORAL | 0 refills | Status: DC

## 2022-07-31 MED ORDER — FLUTICASONE PROPIONATE 50 MCG/ACT NA SUSP: 1.0000 | Freq: Every day | NASAL | 0 refills | Status: AC | PRN

## 2022-07-31 MED ORDER — PREDNISONE 20 MG PO TABS: 20.0000 mg | ORAL_TABLET | Freq: Every day | ORAL | 0 refills | Status: DC

## 2022-07-31 NOTE — Progress Notes (Signed)
Acute Office Visit  Subjective:     Patient ID: Jerry Gill, male    DOB: 1961-05-08, 61 y.o.   MRN: [id number]  Chief Complaint  Patient presents with   Cough    No better, has been going on 4-5 days    Cough This is a recurrent problem. The current episode started in the past 7 days. The problem has been unchanged. The problem occurs constantly. The cough is Productive of sputum. Associated symptoms include nasal congestion and wheezing. Pertinent negatives include no chest pain, chills, fever, heartburn or sore throat. Nothing aggravates the symptoms. He has tried prescription cough suppressant for the symptoms. The treatment provided no relief.    Review of Systems  Constitutional: Negative.  Negative for chills, fever and malaise/fatigue.  HENT:  Positive for congestion. Negative for sinus pain and sore throat.   Eyes: Negative.   Respiratory:  Positive for cough and wheezing.   Cardiovascular: Negative.  Negative for chest pain.  Gastrointestinal: Negative.  Negative for heartburn.  Genitourinary: Negative.   Musculoskeletal: Negative.   Skin: Negative.   Neurological: Negative.   Psychiatric/Behavioral: Negative.    All other systems reviewed and are negative.       Objective:    BP (!) 164/95   Pulse 67   Temp 98.4 F (36.9 C) (Temporal)   Ht 5\' 11"  (1.803 m)   Wt 184 lb 3.2 oz (83.6 kg)   SpO2 98%   BMI 25.69 kg/m  BP Readings from Last 3 Encounters:  07/31/22 (!) 164/95  11/03/21 137/87  10/02/21 139/86      Physical Exam Vitals and nursing note reviewed.  Constitutional:      Appearance: Normal appearance.  HENT:     Head: Normocephalic.     Right Ear: External ear normal.     Left Ear: External ear normal.     Nose: Congestion present.     Mouth/Throat:     Mouth: Mucous membranes are moist.     Pharynx: Oropharynx is clear.  Eyes:     Conjunctiva/sclera: Conjunctivae normal.  Cardiovascular:     Rate and Rhythm: Normal rate and  regular rhythm.     Pulses: Normal pulses.     Heart sounds: Normal heart sounds.  Pulmonary:     Effort: Pulmonary effort is normal.     Breath sounds: Normal breath sounds.  Abdominal:     General: Bowel sounds are normal.  Musculoskeletal:        General: Normal range of motion.  Skin:    General: Skin is warm.  Neurological:     General: No focal deficit present.     Mental Status: He is alert and oriented to person, place, and time.  Psychiatric:        Mood and Affect: Mood normal.        Behavior: Behavior normal.     No results found for any visits on 07/31/22.      Assessment & Plan:  Patient presents with cough, congestion.  Symptoms unresolved in the past few days.  Symptoms present as bronchitis.  Encourage smoking cessation. Take meds as prescribed - Use a cool mist humidifier  -Use saline nose sprays frequently -Force fluids -For fever or aches or pains- take Tylenol or ibuprofen. -If symptoms do not improve, she may need to be COVID tested to rule this out Follow up with worsening unresolved symptoms  Problem List Items Addressed This Visit   None Visit Diagnoses  Bronchitis    -  Primary   Relevant Medications   albuterol (VENTOLIN HFA) 108 (90 Base) MCG/ACT inhaler   fluticasone (FLONASE) 50 MCG/ACT nasal spray   predniSONE (DELTASONE) 20 MG tablet   guaiFENesin (MUCINEX) 600 MG 12 hr tablet       Meds ordered this encounter  Medications   albuterol (VENTOLIN HFA) 108 (90 Base) MCG/ACT inhaler    Sig: Inhale 2 puffs into the lungs every 4 (four) hours as needed.    Dispense:  18 g    Refill:  1   fluticasone (FLONASE) 50 MCG/ACT nasal spray    Sig: Place 1 spray into both nostrils daily as needed for allergies or rhinitis. (Needs to be seen before next refill)    Dispense:  16 g    Refill:  0   predniSONE (DELTASONE) 20 MG tablet    Sig: Take 1 tablet (20 mg total) by mouth daily with breakfast.    Dispense:  6 tablet    Refill:  0     Order Specific Question:   Supervising Provider    Answer:   Mechele Claude [[id number]]   guaiFENesin (MUCINEX) 600 MG 12 hr tablet    Sig: Take 1 tablet (600 mg total) by mouth 2 (two) times daily.    Dispense:  30 tablet    Refill:  0    Order Specific Question:   Supervising Provider    Answer:   Standley Brooking   benzonatate (TESSALON PERLES) 100 MG capsule    Sig: Take 2 capsules (200 mg total) by mouth 3 (three) times daily as needed.    Dispense:  40 capsule    Refill:  0    Order Specific Question:   Supervising Provider    Answer:   Mechele Claude [[id number]]    Return if symptoms worsen or fail to improve.  Daryll Drown, NP

## 2022-07-31 NOTE — Patient Instructions (Signed)
  Acute Bronchitis, Adult  Acute bronchitis is when air tubes in the lungs (bronchi) suddenly get swollen. The condition can make it hard for you to breathe. In adults, acute bronchitis usually goes away within 2 weeks. A cough caused by bronchitis may last up to 3 weeks. Smoking, allergies, and asthma can make the condition worse. What are the causes? Germs that cause cold and flu (viruses). The most common cause of this condition is the virus that causes the common cold. Bacteria. Substances that bother (irritate) the lungs, including: Smoke from cigarettes and other types of tobacco. Dust and pollen. Fumes from chemicals, gases, or burned fuel. Indoor or outdoor air pollution. What increases the risk? A weak body's defense system. This is also called the immune system. Any condition that affects your lungs and breathing, such as asthma. What are the signs or symptoms? A cough. Coughing up clear, yellow, or green mucus. Making high-pitched whistling sounds when you breathe, most often when you breathe out (wheezing). Runny or stuffy nose. Having too much mucus in your lungs (chest congestion). Shortness of breath. Body aches. A sore throat. How is this treated? Acute bronchitis may go away over time without treatment. Your doctor may tell you to: Drink more fluids. This will help thin your mucus so it is easier to cough up. Use a device that gets medicine into your lungs (inhaler). Use a vaporizer or a humidifier. These are machines that add water to the air. This helps with coughing and poor breathing. Take a medicine that thins mucus and helps clear it from your lungs. Take a medicine that prevents or stops coughing. It is not common to take an antibiotic medicine for this condition. Follow these instructions at home:  Take over-the-counter and prescription medicines only as told by your doctor. Use an inhaler, vaporizer, or humidifier as told by your doctor. Take two  teaspoons (10 mL) of honey at bedtime. This helps lessen your coughing at night. Drink enough fluid to keep your pee (urine) pale yellow. Do not smoke or use any products that contain nicotine or tobacco. If you need help quitting, ask your doctor. Get a lot of rest. Return to your normal activities when your doctor says that it is safe. Keep all follow-up visits. How is this prevented?  Wash your hands often with soap and water for at least 20 seconds. If you cannot use soap and water, use hand sanitizer. Avoid contact with people who have cold symptoms. Try not to touch your mouth, nose, or eyes with your hands. Avoid breathing in smoke or chemical fumes. Make sure to get the flu shot every year. Contact a doctor if: Your symptoms do not get better in 2 weeks. You have trouble coughing up the mucus. Your cough keeps you awake at night. You have a fever. Get help right away if: You cough up blood. You have chest pain. You have very bad shortness of breath. You faint or keep feeling like you are going to faint. You have a very bad headache. Your fever or chills get worse. These symptoms may be an emergency. Get help right away. Call your local emergency services (911 in the [place] Do not wait to see if the symptoms will go away. Do not drive yourself to the hospital. Summary Acute bronchitis is when air tubes in the lungs (bronchi) suddenly get swollen. In adults, acute bronchitis usually goes away within 2 weeks. Drink more fluids. This will help thin your mucus so it   is easier to cough up. Take over-the-counter and prescription medicines only as told by your doctor. Contact a doctor if your symptoms do not improve after 2 weeks of treatment. This information is not intended to replace advice given to you by your health care provider. Make sure you discuss any questions you have with your health care provider. Document Revised: 01/01/2021 Document Reviewed: 01/01/2021 Elsevier  Patient Education  2023 [employer]  

## 2022-08-13 DIAGNOSIS — F17210 Nicotine dependence, cigarettes, uncomplicated: Secondary | ICD-10-CM | POA: Diagnosis not present

## 2022-08-13 DIAGNOSIS — J309 Allergic rhinitis, unspecified: Secondary | ICD-10-CM | POA: Diagnosis not present

## 2022-08-13 DIAGNOSIS — R059 Cough, unspecified: Secondary | ICD-10-CM | POA: Diagnosis not present

## 2022-08-13 DIAGNOSIS — M549 Dorsalgia, unspecified: Secondary | ICD-10-CM | POA: Diagnosis not present

## 2022-08-13 DIAGNOSIS — R0989 Other specified symptoms and signs involving the circulatory and respiratory systems: Secondary | ICD-10-CM | POA: Diagnosis not present

## 2022-09-03 ENCOUNTER — Ambulatory Visit: Payer: Medicare HMO | Admitting: Family Medicine

## 2022-10-08 ENCOUNTER — Ambulatory Visit (INDEPENDENT_AMBULATORY_CARE_PROVIDER_SITE_OTHER): Payer: Medicare HMO | Admitting: Family Medicine

## 2022-10-08 ENCOUNTER — Encounter: Payer: Self-pay | Admitting: Family Medicine

## 2022-10-08 VITALS — BP 142/84 | HR 87 | Temp 99.2°F | Ht 71.0 in | Wt 188.4 lb

## 2022-10-08 DIAGNOSIS — J329 Chronic sinusitis, unspecified: Secondary | ICD-10-CM

## 2022-10-08 DIAGNOSIS — J40 Bronchitis, not specified as acute or chronic: Secondary | ICD-10-CM | POA: Diagnosis not present

## 2022-10-08 DIAGNOSIS — I10 Essential (primary) hypertension: Secondary | ICD-10-CM

## 2022-10-08 DIAGNOSIS — E785 Hyperlipidemia, unspecified: Secondary | ICD-10-CM | POA: Diagnosis not present

## 2022-10-08 DIAGNOSIS — Z125 Encounter for screening for malignant neoplasm of prostate: Secondary | ICD-10-CM

## 2022-10-08 MED ORDER — OMEPRAZOLE 20 MG PO CPDR: 20.0000 mg | DELAYED_RELEASE_CAPSULE | Freq: Every day | ORAL | 3 refills | Status: DC

## 2022-10-08 MED ORDER — BENZONATATE 200 MG PO CAPS: 200.0000 mg | ORAL_CAPSULE | Freq: Three times a day (TID) | ORAL | 0 refills | Status: DC | PRN

## 2022-10-08 MED ORDER — METOPROLOL SUCCINATE ER 100 MG PO TB24: 100.0000 mg | ORAL_TABLET | Freq: Every day | ORAL | 3 refills | Status: DC

## 2022-10-08 MED ORDER — ROSUVASTATIN CALCIUM 5 MG PO TABS: 5.0000 mg | ORAL_TABLET | Freq: Every day | ORAL | 3 refills | Status: DC

## 2022-10-08 MED ORDER — AZITHROMYCIN 250 MG PO TABS: ORAL_TABLET | ORAL | 0 refills | Status: DC

## 2022-10-08 NOTE — Progress Notes (Signed)
Subjective:  Patient ID: Jerry Gill, male    DOB: [birth year]  Age: 62 y.o. MRN: [id number]  CC: Medical Management of Chronic Issues   HPI Jevan Gaunt presents for  follow-up of hypertension. Patient has no history of headache chest pain or shortness of breath or recent cough. Patient also denies symptoms of TIA such as focal numbness or weakness. Patient denies side effects from medication. States taking it regularly.Home BP is 142/80 or so.    in for follow-up of elevated cholesterol. Doing well without complaints on current medication. Denies side effects of statin including myalgia and arthralgia and nausea. Currently no chest pain, shortness of breath or other cardiovascular related symptoms noted.  Patient presents with upper respiratory congestion.Chest feels tight anteriorly. Patient reports coughing frequently as well.  Moderate sputum noted. There is no fever, chills or sweats. The patient denies mildly short of breath. Onset was 5 days ago. Gradually worsening. Tried OTCs without improvement.   History Ragan has a past medical history of AAA (abdominal aortic aneurysm) (HCC), Collagen vascular disease (HCC), DDD (degenerative disc disease), lumbar, and Hypertension.   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   His family history includes AAA (abdominal aortic aneurysm) in his father; Cancer in his father; Heart disease in his father and mother; Hyperlipidemia in his brother; Hypertension in his mother.He reports that he has been smoking cigarettes. He has a 23.50 pack-year smoking history. He has never used smokeless tobacco. He reports that he does not drink alcohol and does not use drugs.  Current Outpatient Medications on File Prior to Visit  Medication Sig Dispense Refill   albuterol (VENTOLIN HFA) 108 (90 Base) MCG/ACT inhaler Inhale 2 puffs into the lungs every 4  (four) hours as needed. 18 g 1   aspirin EC 81 MG tablet Take 81 mg by mouth every evening.     clobetasol (TEMOVATE) 0.05 % external solution Apply 1 application topically 2 (two) times daily. 50 mL 11   fluticasone (FLONASE) 50 MCG/ACT nasal spray Place 1 spray into both nostrils daily as needed for allergies or rhinitis. (Needs to be seen before next refill) 16 g 0   Multiple Vitamins-Minerals (MULTIVITAMIN GUMMIES MENS PO) Take 1 tablet by mouth 2 (two) times daily.     nabumetone (RELAFEN) 500 MG tablet Take 2 tablets (1,000 mg total) by mouth 2 (two) times daily. For muscle and joint pain 360 tablet 1   sildenafil (REVATIO) 20 MG tablet TAKE 2-5 TABLETS AS NEEDED PRIOR TO SEXUAL ACTIVITY 90 tablet 3   tiZANidine (ZANAFLEX) 4 MG tablet Take 1 tablet (4 mg total) by mouth every 6 (six) hours as needed for muscle spasms. 30 tablet 1   No current facility-administered medications on file prior to visit.    ROS Review of Systems  Constitutional:  Negative for fever.  Respiratory:  Negative for shortness of breath.   Cardiovascular:  Negative for chest pain.  Musculoskeletal:  Negative for arthralgias.  Skin:  Negative for rash.    Objective:  BP (!) 142/84   Pulse 87   Temp 99.2 F (37.3 C)   Ht 5\' 11"  (1.803 m)   Wt 188 lb 6.4 oz (85.5 kg)   SpO2 95%   BMI 26.28 kg/m   BP Readings from Last 3 Encounters:  10/08/22 (!) 142/84  07/31/22 (!) 164/95  11/03/21 137/87    Wt Readings from Last 3 Encounters:  10/08/22 188 lb 6.4 oz (85.5 kg)  07/31/22 184 lb 3.2 oz (83.6 kg)  11/17/21 178 lb (80.7 kg)     Physical Exam Vitals reviewed.  Constitutional:      Appearance: He is well-developed.  HENT:     Head: Normocephalic and atraumatic.     Right Ear: External ear normal.     Left Ear: External ear normal.     Mouth/Throat:     Pharynx: No oropharyngeal exudate or posterior oropharyngeal erythema.  Eyes:     Pupils: Pupils are equal, round, and reactive to light.   Cardiovascular:     Rate and Rhythm: Normal rate and regular rhythm.     Heart sounds: No murmur heard. Pulmonary:     Effort: No respiratory distress.     Breath sounds: Normal breath sounds.  Musculoskeletal:     Cervical back: Normal range of motion and neck supple.  Neurological:     Mental Status: He is alert and oriented to person, place, and time.       Assessment & Plan:   Antion was seen today for medical management of chronic issues.  Diagnoses and all orders for this visit:  Essential hypertension -     CBC with Differential/Platelet -     CMP14+EGFR -     metoprolol succinate (TOPROL-XL) 100 MG 24 hr tablet; Take 1 tablet (100 mg total) by mouth daily.  Hyperlipidemia, unspecified hyperlipidemia type -     Lipid panel  Prostate cancer screening -     PSA, total and free  Sinobronchitis  Other orders -     omeprazole (PRILOSEC) 20 MG capsule; Take 1 capsule (20 mg total) by mouth daily. For reflux -     rosuvastatin (CRESTOR) 5 MG tablet; Take 1 tablet (5 mg total) by mouth daily. For cholesterol -     azithromycin (ZITHROMAX Z-PAK) 250 MG tablet; Take two right away Then one a day for the next 4 days. -     benzonatate (TESSALON) 200 MG capsule; Take 1 capsule (200 mg total) by mouth 3 (three) times daily as needed for cough.   Allergies as of 10/08/2022       Reactions   Codeine Nausea And Vomiting   Penicillins Itching, Rash   Has patient had a PCN reaction causing immediate rash, facial/tongue/throat swelling, SOB or lightheadedness with hypotension: Unknown, childhood reaction Has patient had a PCN reaction causing severe rash involving mucus membranes or skin necrosis: No Has patient had a PCN reaction that required hospitalization No Has patient had a PCN reaction occurring within the last 10 years: No If all of the above answers are "NO", then may proceed with Cephalosporin use.   Sulfa Antibiotics Itching        Medication List         Accurate as of October 08, 2022  5:23 PM. If you have any questions, ask your nurse or doctor.          STOP taking these medications    guaiFENesin 600 MG 12 hr tablet Commonly known as: Mucinex Stopped by: Claretta Fraise, MD   predniSONE 20 MG tablet Commonly known as: DELTASONE Stopped by: Claretta Fraise, MD       TAKE these medications    albuterol 108 (90 Base) MCG/ACT inhaler Commonly known as: VENTOLIN HFA Inhale 2 puffs into the lungs every 4 (four) hours as needed.   aspirin EC 81 MG tablet Take 81 mg by mouth every evening.  azithromycin 250 MG tablet Commonly known as: Zithromax Z-Pak Take two right away Then one a day for the next 4 days. Started by: Claretta Fraise, MD   benzonatate 200 MG capsule Commonly known as: TESSALON Take 1 capsule (200 mg total) by mouth 3 (three) times daily as needed for cough. What changed:  medication strength reasons to take this Changed by: Claretta Fraise, MD   clobetasol 0.05 % external solution Commonly known as: TEMOVATE Apply 1 application topically 2 (two) times daily.   fluticasone 50 MCG/ACT nasal spray Commonly known as: FLONASE Place 1 spray into both nostrils daily as needed for allergies or rhinitis. (Needs to be seen before next refill)   metoprolol succinate 100 MG 24 hr tablet Commonly known as: TOPROL-XL Take 1 tablet (100 mg total) by mouth daily.   MULTIVITAMIN GUMMIES MENS PO Take 1 tablet by mouth 2 (two) times daily.   nabumetone 500 MG tablet Commonly known as: RELAFEN Take 2 tablets (1,000 mg total) by mouth 2 (two) times daily. For muscle and joint pain   omeprazole 20 MG capsule Commonly known as: PRILOSEC Take 1 capsule (20 mg total) by mouth daily. For reflux   rosuvastatin 5 MG tablet Commonly known as: Crestor Take 1 tablet (5 mg total) by mouth daily. For cholesterol   sildenafil 20 MG tablet Commonly known as: REVATIO TAKE 2-5 TABLETS AS NEEDED PRIOR TO SEXUAL ACTIVITY    tiZANidine 4 MG tablet Commonly known as: ZANAFLEX Take 1 tablet (4 mg total) by mouth every 6 (six) hours as needed for muscle spasms.        Meds ordered this encounter  Medications   metoprolol succinate (TOPROL-XL) 100 MG 24 hr tablet    Sig: Take 1 tablet (100 mg total) by mouth daily.    Dispense:  90 tablet    Refill:  3   omeprazole (PRILOSEC) 20 MG capsule    Sig: Take 1 capsule (20 mg total) by mouth daily. For reflux    Dispense:  90 capsule    Refill:  3   rosuvastatin (CRESTOR) 5 MG tablet    Sig: Take 1 tablet (5 mg total) by mouth daily. For cholesterol    Dispense:  90 tablet    Refill:  3   azithromycin (ZITHROMAX Z-PAK) 250 MG tablet    Sig: Take two right away Then one a day for the next 4 days.    Dispense:  6 each    Refill:  0   benzonatate (TESSALON) 200 MG capsule    Sig: Take 1 capsule (200 mg total) by mouth 3 (three) times daily as needed for cough.    Dispense:  20 capsule    Refill:  0      Follow-up: Return in about 6 months (around 04/08/2023).  Claretta Fraise, M.D.

## 2022-10-09 LAB — CMP14+EGFR
ALT: 24 IU/L (ref 0–44)
AST: 14 IU/L (ref 0–40)
Albumin/Globulin Ratio: 1.8 (ref 1.2–2.2)
Albumin: 4.3 g/dL (ref 3.9–4.9)
Alkaline Phosphatase: 64 IU/L (ref 44–121)
BUN/Creatinine Ratio: 11 (ref 10–24)
BUN: 10 mg/dL (ref 8–27)
Bilirubin Total: 0.2 mg/dL (ref 0.0–1.2)
CO2: 21 mmol/L (ref 20–29)
Calcium: 9 mg/dL (ref 8.6–10.2)
Chloride: 101 mmol/L (ref 96–106)
Creatinine, Ser: 0.89 mg/dL (ref 0.76–1.27)
Globulin, Total: 2.4 g/dL (ref 1.5–4.5)
Glucose: 80 mg/dL (ref 70–99)
Potassium: 4.4 mmol/L (ref 3.5–5.2)
Sodium: 138 mmol/L (ref 134–144)
Total Protein: 6.7 g/dL (ref 6.0–8.5)
eGFR: 96 mL/min/{1.73_m2} (ref >59)

## 2022-10-09 LAB — CBC WITH DIFFERENTIAL/PLATELET
Basophils Absolute: 0 10*3/uL (ref 0.0–0.2)
Basos: 0 %
EOS (ABSOLUTE): 0.1 10*3/uL (ref 0.0–0.4)
Eos: 2 %
Hematocrit: 44.6 % (ref 37.5–51.0)
Hemoglobin: 14.9 g/dL (ref 13.0–17.7)
Immature Grans (Abs): 0 10*3/uL (ref 0.0–0.1)
Immature Granulocytes: 0 %
Lymphocytes Absolute: 1 10*3/uL (ref 0.7–3.1)
Lymphs: 21 %
MCH: 30 pg (ref 26.6–33.0)
MCHC: 33.4 g/dL (ref 31.5–35.7)
MCV: 90 fL (ref 79–97)
Monocytes Absolute: 0.6 10*3/uL (ref 0.1–0.9)
Monocytes: 12 %
Neutrophils Absolute: 3.2 10*3/uL (ref 1.4–7.0)
Neutrophils: 65 %
Platelets: 257 10*3/uL (ref 150–450)
RBC: 4.97 x10E6/uL (ref 4.14–5.80)
RDW: 13.3 % (ref 11.6–15.4)
WBC: 4.9 10*3/uL (ref 3.4–10.8)

## 2022-10-09 LAB — LIPID PANEL
Chol/HDL Ratio: 7.4 ratio — ABNORMAL HIGH (ref 0.0–5.0)
Cholesterol, Total: 185 mg/dL (ref 100–199)
HDL: 25 mg/dL — ABNORMAL LOW (ref >39)
LDL Chol Calc (NIH): 129 mg/dL — ABNORMAL HIGH (ref 0–99)
Triglycerides: 174 mg/dL — ABNORMAL HIGH (ref 0–149)
VLDL Cholesterol Cal: 31 mg/dL (ref 5–40)

## 2022-10-09 LAB — PSA, TOTAL AND FREE
PSA, Free Pct: 35 %
PSA, Free: 0.14 ng/mL
Prostate Specific Ag, Serum: 0.4 ng/mL (ref 0.0–4.0)

## 2022-10-12 ENCOUNTER — Other Ambulatory Visit: Payer: Self-pay | Admitting: Family Medicine

## 2022-10-12 MED ORDER — ROSUVASTATIN CALCIUM 20 MG PO TABS: 20.0000 mg | ORAL_TABLET | Freq: Every day | ORAL | 1 refills | Status: DC

## 2022-10-24 DIAGNOSIS — I10 Essential (primary) hypertension: Secondary | ICD-10-CM | POA: Diagnosis not present

## 2022-10-24 DIAGNOSIS — R059 Cough, unspecified: Secondary | ICD-10-CM | POA: Diagnosis not present

## 2022-10-24 DIAGNOSIS — Z720 Tobacco use: Secondary | ICD-10-CM | POA: Diagnosis not present

## 2022-10-24 DIAGNOSIS — J329 Chronic sinusitis, unspecified: Secondary | ICD-10-CM | POA: Diagnosis not present

## 2022-10-24 DIAGNOSIS — R0981 Nasal congestion: Secondary | ICD-10-CM | POA: Diagnosis not present

## 2022-10-24 DIAGNOSIS — I714 Abdominal aortic aneurysm, without rupture, unspecified: Secondary | ICD-10-CM | POA: Diagnosis not present

## 2022-11-27 ENCOUNTER — Telehealth: Payer: Self-pay | Admitting: Family Medicine

## 2022-11-27 NOTE — Telephone Encounter (Signed)
Contacted [name] to schedule their annual wellness visit. Appointment made for 12/08/2022.  Thank you,  Colletta [place],  [place] [facility] Direct Dial [phone]

## 2022-12-08 ENCOUNTER — Ambulatory Visit (INDEPENDENT_AMBULATORY_CARE_PROVIDER_SITE_OTHER): Payer: Medicare HMO

## 2022-12-08 VITALS — Ht 71.0 in | Wt 187.0 lb

## 2022-12-08 DIAGNOSIS — Z122 Encounter for screening for malignant neoplasm of respiratory organs: Secondary | ICD-10-CM

## 2022-12-08 DIAGNOSIS — Z0000 Encounter for general adult medical examination without abnormal findings: Secondary | ICD-10-CM

## 2022-12-08 NOTE — Progress Notes (Signed)
Subjective:   Jerry Gill is a 62 y.o. male who presents for Medicare Annual/Subsequent preventive examination. I connected with  [name] on 12/08/22 by a audio enabled telemedicine application and verified that I am speaking with the correct person using two identifiers.  Patient Location: Home  Provider Location: Home Office  I discussed the limitations of evaluation and management by telemedicine. The patient expressed understanding and agreed to proceed.  Review of Systems     Cardiac Risk Factors include: advanced age (>6men, >6 women);male gender;hypertension;dyslipidemia     Objective:    Today's Vitals   12/08/22 1326  Weight: 187 lb (84.8 kg)  Height: 5\' 11"  (1.803 m)   Body mass index is 26.08 kg/m.     12/08/2022    1:29 PM 11/17/2021    9:14 AM 07/25/2017    9:56 PM 04/05/2016    4:52 PM 01/21/2016    2:02 PM 12/20/2015    1:00 PM 12/16/2015    9:30 AM  Advanced Directives  Does Patient Have a Medical Advance Directive? No No No No No No No  Would patient like information on creating a medical advance directive? No - Patient declined No - Patient declined    No - patient declined information No - patient declined information    Current Medications (verified) Outpatient Encounter Medications as of 12/08/2022  Medication Sig   albuterol (VENTOLIN HFA) 108 (90 Base) MCG/ACT inhaler Inhale 2 puffs into the lungs every 4 (four) hours as needed.   aspirin EC 81 MG tablet Take 81 mg by mouth every evening.   azithromycin (ZITHROMAX Z-PAK) 250 MG tablet Take two right away Then one a day for the next 4 days.   benzonatate (TESSALON) 200 MG capsule Take 1 capsule (200 mg total) by mouth 3 (three) times daily as needed for cough.   clobetasol (TEMOVATE) 0.05 % external solution Apply 1 application topically 2 (two) times daily.   fluticasone (FLONASE) 50 MCG/ACT nasal spray Place 1 spray into both nostrils daily as needed for allergies or rhinitis. (Needs to be seen  before next refill)   metoprolol succinate (TOPROL-XL) 100 MG 24 hr tablet Take 1 tablet (100 mg total) by mouth daily.   Multiple Vitamins-Minerals (MULTIVITAMIN GUMMIES MENS PO) Take 1 tablet by mouth 2 (two) times daily.   nabumetone (RELAFEN) 500 MG tablet Take 2 tablets (1,000 mg total) by mouth 2 (two) times daily. For muscle and joint pain   omeprazole (PRILOSEC) 20 MG capsule Take 1 capsule (20 mg total) by mouth daily. For reflux   rosuvastatin (CRESTOR) 20 MG tablet Take 1 tablet (20 mg total) by mouth daily. For cholesterol   sildenafil (REVATIO) 20 MG tablet TAKE 2-5 TABLETS AS NEEDED PRIOR TO SEXUAL ACTIVITY   tiZANidine (ZANAFLEX) 4 MG tablet Take 1 tablet (4 mg total) by mouth every 6 (six) hours as needed for muscle spasms.   No facility-administered encounter medications on file as of 12/08/2022.    Allergies (verified) Codeine, Penicillins, and Sulfa antibiotics   History: Past Medical History:  Diagnosis Date   AAA (abdominal aortic aneurysm) (HCC)    Collagen vascular disease ([place])    DDD (degenerative disc disease), lumbar    Hypertension    Past Surgical History:  Procedure Laterality Date   ABDOMINAL AORTIC ENDOVASCULAR STENT GRAFT N/A 12/20/2015   Procedure: ABDOMINAL AORTIC ENDOVASCULAR STENT GRAFT;  Surgeon: Rosetta Posner, MD;  Location: [place];  Service: Vascular;  Laterality: N/A;   hemmrroidectomy  UMBILICAL HERNIA REPAIR     as infant     Family History  Problem Relation Age of Onset   Heart disease Father        before age 24   AAA (abdominal aortic aneurysm) Father    Cancer Father    Heart disease Mother        before age 79   Hypertension Mother    Hyperlipidemia Brother    Social History   Socioeconomic History   Marital status: Married    Spouse name: Tammy   Number of children: 7   Years of education: 8th   Highest education level: Not on file  Occupational History   Occupation: truck driver  Tobacco Use   Smoking status: Some  Days    Packs/day: 0.50    Years: 47.00    Additional pack years: 0.00    Total pack years: 23.50    Types: Cigarettes   Smokeless tobacco: Never  Vaping Use   Vaping Use: Never used  Substance and Sexual Activity   Alcohol use: No    Alcohol/week: 0.0 standard drinks of alcohol   Drug use: No   Sexual activity: Not on file  Other Topics Concern   Not on file  Social History Narrative   epworth sleepiness scale - 9 (12/13/2015)   Lives with wife Tammy.   Social Determinants of Health   Financial Resource Strain: Low Risk  (12/08/2022)   Overall Financial Resource Strain (CARDIA)    Difficulty of Paying Living Expenses: Not hard at all  Food Insecurity: No Food Insecurity (12/08/2022)   Hunger Vital Sign    Worried About Running Out of Food in the Last Year: Never true    Ran Out of Food in the Last Year: Never true  Transportation Needs: No Transportation Needs (12/08/2022)   PRAPARE - Hydrologist (Medical): No    Lack of Transportation (Non-Medical): No  Physical Activity: Insufficiently Active (12/08/2022)   Exercise Vital Sign    Days of Exercise per Week: 3 days    Minutes of Exercise per Session: 30 min  Stress: No Stress Concern Present (12/08/2022)   [place]    Feeling of Stress : Not at all  Social Connections: Moderately Integrated (12/08/2022)   Social Connection and Isolation Panel [NHANES]    Frequency of Communication with Friends and Family: More than three times a week    Frequency of Social Gatherings with Friends and Family: More than three times a week    Attends Religious Services: 1 to 4 times per year    Active Member of [employer] or Organizations: No    Attends Music therapist: Never    Marital Status: Married    Tobacco Counseling Ready to quit: No Counseling given: Not Answered   Clinical Intake:  Pre-visit preparation completed:  Yes  Pain : No/denies pain     Nutritional Risks: None Diabetes: No  How often do you need to have someone help you when you read instructions, pamphlets, or other written materials from your doctor or pharmacy?: 1 - Never  Diabetic?no   Interpreter Needed?: No  Information entered by :: Jadene Pierini, LPN   Activities of Daily Living    12/08/2022    1:29 PM  In your present state of health, do you have any difficulty performing the following activities:  Hearing? 0  Vision? 0  Difficulty concentrating or making decisions?  0  Walking or climbing stairs? 0  Dressing or bathing? 0  Doing errands, shopping? 0  Preparing Food and eating ? N  Using the Toilet? N  In the past six months, have you accidently leaked urine? N  Do you have problems with loss of bowel control? N  Managing your Medications? N  Managing your Finances? N  Housekeeping or managing your Housekeeping? N    Patient Care Team: Claretta Fraise, MD as PCP - General (Family Medicine)  Indicate any recent Medical Services you may have received from other than [facility] providers in the past year (date may be approximate).     Assessment:   This is a routine wellness examination for [facility].  Hearing/Vision screen Vision Screening - Comments:: Wears rx glasses - up to date with routine eye exams with  [place] [facility]   Dietary issues and exercise activities discussed: Current Exercise Habits: Home exercise routine, Type of exercise: walking, Time (Minutes): 30, Frequency (Times/Week): 3, Weekly Exercise (Minutes/Week): 90, Intensity: Mild, Exercise limited by: None identified   Goals Addressed             This Visit's Progress    DIET - INCREASE WATER INTAKE         Depression Screen    12/08/2022    1:28 PM 10/08/2022    1:56 PM 07/31/2022   11:06 AM 11/17/2021    9:10 AM 11/03/2021    1:02 PM 10/02/2021    9:37 AM 07/30/2021    9:53 AM  PHQ 2/9 Scores  PHQ - 2 Score 0 0 0 0 0 0 0  PHQ- 9 Score    0        Fall Risk    12/08/2022    1:27 PM 10/08/2022    1:56 PM 07/31/2022   11:05 AM 11/17/2021    9:14 AM 11/03/2021    1:02 PM  [place] in the past year? 0 0 0 0 0  Number falls in past yr: 0  0 0   Injury with Fall? 0  0 0   Risk for fall due to : No Fall Risks  No Fall Risks No Fall Risks   Follow up Falls prevention discussed  Falls evaluation completed Falls prevention discussed     FALL RISK PREVENTION PERTAINING TO THE HOME:  Any stairs in or around the home? No  If so, are there any without handrails? No  Home free of loose throw rugs in walkways, pet beds, electrical cords, etc? Yes  Adequate lighting in your home to reduce risk of falls? Yes   ASSISTIVE DEVICES UTILIZED TO PREVENT FALLS:  Life alert? No  Use of a cane, walker or w/c? No  Grab bars in the bathroom? No  Shower chair or bench in shower? Yes  Elevated toilet seat or a handicapped toilet? No          12/08/2022    1:30 PM 11/17/2021    9:16 AM  6CIT Screen  What Year? 0 points 0 points  What month? 0 points 0 points  What time? 0 points 0 points  Count back from 20 0 points 0 points  Months in reverse 0 points 0 points  Repeat phrase 0 points 0 points  Total Score 0 points 0 points    Immunizations Immunization History  Administered Date(s) Administered   Tdap 01/04/2015    TDAP status: Up to date  Flu Vaccine status: Declined, Education has been provided regarding  the importance of this vaccine but patient still declined. Advised may receive this vaccine at local pharmacy or Health Dept. Aware to provide a copy of the vaccination record if obtained from local pharmacy or Health Dept. Verbalized acceptance and understanding.  Pneumococcal vaccine status: Due, Education has been provided regarding the importance of this vaccine. Advised may receive this vaccine at local pharmacy or Health Dept. Aware to provide a copy of the vaccination record if obtained from local pharmacy or  Health Dept. Verbalized acceptance and understanding.  Covid-19 vaccine status: Completed vaccines  Qualifies for Shingles Vaccine? Yes   Zostavax completed No   Shingrix Completed?: No.    Education has been provided regarding the importance of this vaccine. Patient has been advised to call insurance company to determine out of pocket expense if they have not yet received this vaccine. Advised may also receive vaccine at local pharmacy or Health Dept. Verbalized acceptance and understanding.  Screening Tests Health Maintenance  Topic Date Due   COVID-19 Vaccine (1) Never done   COLONOSCOPY (Pts 45-74yrs Insurance coverage will need to be confirmed)  Never done   Lung Cancer Screening  Never done   Zoster Vaccines- Shingrix (1 of 2) Never done   INFLUENZA VACCINE  12/13/2022 (Originally 04/14/2022)   Hepatitis C Screening  10/09/2023 (Originally [birth year]1977)   HIV Screening  10/09/2023 (Originally 04/14/1974)   Medicare Annual Wellness (AWV)  12/08/2023   DTaP/Tdap/Td (2 - Td or Tdap) 01/03/2025   HPV VACCINES  Aged Out    Health Maintenance  Health Maintenance Due  Topic Date Due   COVID-19 Vaccine (1) Never done   COLONOSCOPY (Pts 45-57yrs Insurance coverage will need to be confirmed)  Never done   Lung Cancer Screening  Never done   Zoster Vaccines- Shingrix (1 of 2) Never done    Colorectal cancer screening: Referral to GI placed declined . Pt aware the office will call re: appt.  Lung Cancer Screening: (Low Dose CT Chest recommended if Age 84-80 years, 30 pack-year currently smoking OR have quit w/in 15years.) does qualify.   Lung Cancer Screening Referral: referral 12/08/2022  Additional Screening:  Hepatitis C Screening: does qualify;   Vision Screening: Recommended annual ophthalmology exams for early detection of glaucoma and other disorders of the eye. Is the patient up to date with their annual eye exam?  Yes  Who is the provider or what is the name of the office in  which the patient attends annual eye exams? [facility]  If pt is not established with a provider, would they like to be referred to a provider to establish care? No .   Dental Screening: Recommended annual dental exams for proper oral hygiene  Community Resource Referral / Chronic Care Management: CRR required this visit?  No   CCM required this visit?  No      Plan:     I have personally reviewed and noted the following in the patient's chart:   Medical and social history Use of alcohol, tobacco or illicit drugs  Current medications and supplements including opioid prescriptions. Patient is not currently taking opioid prescriptions. Functional ability and status Nutritional status Physical activity Advanced directives List of other physicians Hospitalizations, surgeries, and ER visits in previous 12 months Vitals Screenings to include cognitive, depression, and falls Referrals and appointments  In addition, I have reviewed and discussed with patient certain preventive protocols, quality metrics, and best practice recommendations. A written personalized care plan for preventive services as well as  general preventive health recommendations were provided to patient.     Daphane Shepherd, LPN   [id number]   Nurse Notes: Due Pneumonia vaccine

## 2022-12-08 NOTE — Patient Instructions (Signed)
[name] , Thank you for taking time to come for your Medicare Wellness Visit. I appreciate your ongoing commitment to your health goals. Please review the following plan we discussed and let me know if I can assist you in the future.   These are the goals we discussed:  Goals      DIET - INCREASE WATER INTAKE     Quit Smoking     Try to decrease smoking or stop        This is a list of the screening recommended for you and due dates:  Health Maintenance  Topic Date Due   COVID-19 Vaccine (1) Never done   Colon Cancer Screening  Never done   Screening for Lung Cancer  Never done   Zoster (Shingles) Vaccine (1 of 2) Never done   Flu Shot  12/13/2022*   Hepatitis C Screening: USPSTF Recommendation to screen - Ages 18-79 yo.  10/09/2023*   HIV Screening  10/09/2023*   Medicare Annual Wellness Visit  12/08/2023   DTaP/Tdap/Td vaccine (2 - Td or Tdap) 01/03/2025   HPV Vaccine  Aged Out  *Topic was postponed. The date shown is not the original due date.    Advanced directives: Advance directive discussed with you today. I have provided a copy for you to complete at home and have notarized. Once this is complete please bring a copy in to our office so we can scan it into your chart.   Conditions/risks identified: Aim for 30 minutes of exercise or brisk walking, 6-8 glasses of water, and 5 servings of fruits and vegetables each day.   Next appointment: Follow up in one year for your annual wellness visit   Preventive Care 40-64 Years, Male Preventive care refers to lifestyle choices and visits with your health care provider that can promote health and wellness. What does preventive care include? A yearly physical exam. This is also called an annual well check. Dental exams once or twice a year. Routine eye exams. Ask your health care provider how often you should have your eyes checked. Personal lifestyle choices, including: Daily care of your teeth and gums. Regular physical  activity. Eating a healthy diet. Avoiding tobacco and drug use. Limiting alcohol use. Practicing safe sex. Taking low-dose aspirin every day starting at age 50. What happens during an annual well check? The services and screenings done by your health care provider during your annual well check will depend on your age, overall health, lifestyle risk factors, and family history of disease. Counseling  Your health care provider may ask you questions about your: Alcohol use. Tobacco use. Drug use. Emotional well-being. Home and relationship well-being. Sexual activity. Eating habits. Work and work Statistician. Screening  You may have the following tests or measurements: Height, weight, and BMI. Blood pressure. Lipid and cholesterol levels. These may be checked every 5 years, or more frequently if you are over 26 years old. Skin check. Lung cancer screening. You may have this screening every year starting at age 55 if you have a 30-pack-year history of smoking and currently smoke or have quit within the past 15 years. Fecal occult blood test (FOBT) of the stool. You may have this test every year starting at age 62. Flexible sigmoidoscopy or colonoscopy. You may have a sigmoidoscopy every 5 years or a colonoscopy every 10 years starting at age 3. Prostate cancer screening. Recommendations will vary depending on your family history and other risks. Hepatitis C blood test. Hepatitis B blood test. Sexually  transmitted disease (STD) testing. Diabetes screening. This is done by checking your blood sugar (glucose) after you have not eaten for a while (fasting). You may have this done every 1-3 years. Discuss your test results, treatment options, and if necessary, the need for more tests with your health care provider. Vaccines  Your health care provider may recommend certain vaccines, such as: Influenza vaccine. This is recommended every year. Tetanus, diphtheria, and acellular pertussis  (Tdap, Td) vaccine. You may need a Td booster every 10 years. Zoster vaccine. You may need this after age 48. Pneumococcal 13-valent conjugate (PCV13) vaccine. You may need this if you have certain conditions and have not been vaccinated. Pneumococcal polysaccharide (PPSV23) vaccine. You may need one or two doses if you smoke cigarettes or if you have certain conditions. Talk to your health care provider about which screenings and vaccines you need and how often you need them. This information is not intended to replace advice given to you by your health care provider. Make sure you discuss any questions you have with your health care provider. Document Released: 09/27/2015 Document Revised: 05/20/2016 Document Reviewed: 07/02/2015 Elsevier Interactive Patient Education  2017 [place] Prevention in the Home Falls can cause injuries. They can happen to people of all ages. There are many things you can do to make your home safe and to help prevent falls. What can I do on the outside of my home? Regularly fix the edges of walkways and driveways and fix any cracks. Remove anything that might make you trip as you walk through a door, such as a raised step or threshold. Trim any bushes or trees on the path to your home. Use bright outdoor lighting. Clear any walking paths of anything that might make someone trip, such as rocks or tools. Regularly check to see if handrails are loose or broken. Make sure that both sides of any steps have handrails. Any raised decks and porches should have guardrails on the edges. Have any leaves, snow, or ice cleared regularly. Use sand or salt on walking paths during winter. Clean up any spills in your garage right away. This includes oil or grease spills. What can I do in the bathroom? Use night lights. Install grab bars by the toilet and in the tub and shower. Do not use towel bars as grab bars. Use non-skid mats or decals in the tub or shower. If  you need to sit down in the shower, use a plastic, non-slip stool. Keep the floor dry. Clean up any water that spills on the floor as soon as it happens. Remove soap buildup in the tub or shower regularly. Attach bath mats securely with double-sided non-slip rug tape. Do not have throw rugs and other things on the floor that can make you trip. What can I do in the bedroom? Use night lights. Make sure that you have a light by your bed that is easy to reach. Do not use any sheets or blankets that are too big for your bed. They should not hang down onto the floor. Have a firm chair that has side arms. You can use this for support while you get dressed. Do not have throw rugs and other things on the floor that can make you trip. What can I do in the kitchen? Clean up any spills right away. Avoid walking on wet floors. Keep items that you use a lot in easy-to-reach places. If you need to reach something above you, use a strong  step stool that has a grab bar. Keep electrical cords out of the way. Do not use floor polish or wax that makes floors slippery. If you must use wax, use non-skid floor wax. Do not have throw rugs and other things on the floor that can make you trip. What can I do with my stairs? Do not leave any items on the stairs. Make sure that there are handrails on both sides of the stairs and use them. Fix handrails that are broken or loose. Make sure that handrails are as long as the stairways. Check any carpeting to make sure that it is firmly attached to the stairs. Fix any carpet that is loose or worn. Avoid having throw rugs at the top or bottom of the stairs. If you do have throw rugs, attach them to the floor with carpet tape. Make sure that you have a light switch at the top of the stairs and the bottom of the stairs. If you do not have them, ask someone to add them for you. What else can I do to help prevent falls? Wear shoes that: Do not have high heels. Have rubber  bottoms. Are comfortable and fit you well. Are closed at the toe. Do not wear sandals. If you use a stepladder: Make sure that it is fully opened. Do not climb a closed stepladder. Make sure that both sides of the stepladder are locked into place. Ask someone to hold it for you, if possible. Clearly mark and make sure that you can see: Any grab bars or handrails. First and last steps. Where the edge of each step is. Use tools that help you move around (mobility aids) if they are needed. These include: Canes. Walkers. Scooters. Crutches. Turn on the lights when you go into a dark area. Replace any light bulbs as soon as they burn out. Set up your furniture so you have a clear path. Avoid moving your furniture around. If any of your floors are uneven, fix them. If there are any pets around you, be aware of where they are. Review your medicines with your doctor. Some medicines can make you feel dizzy. This can increase your chance of falling. Ask your doctor what other things that you can do to help prevent falls. This information is not intended to replace advice given to you by your health care provider. Make sure you discuss any questions you have with your health care provider. Document Released: 06/27/2009 Document Revised: 02/06/2016 Document Reviewed: 10/05/2014 Elsevier Interactive Patient Education  2017 [employer].

## 2022-12-16 ENCOUNTER — Other Ambulatory Visit: Payer: Self-pay | Admitting: Family Medicine

## 2022-12-16 DIAGNOSIS — N529 Male erectile dysfunction, unspecified: Secondary | ICD-10-CM

## 2022-12-29 ENCOUNTER — Encounter: Payer: Self-pay | Admitting: Family Medicine

## 2022-12-29 ENCOUNTER — Ambulatory Visit (INDEPENDENT_AMBULATORY_CARE_PROVIDER_SITE_OTHER): Payer: Medicare HMO | Admitting: Family Medicine

## 2022-12-29 VITALS — BP 133/82 | HR 72 | Temp 98.4°F | Ht 71.0 in | Wt 182.2 lb

## 2022-12-29 DIAGNOSIS — I10 Essential (primary) hypertension: Secondary | ICD-10-CM | POA: Diagnosis not present

## 2022-12-29 DIAGNOSIS — J0100 Acute maxillary sinusitis, unspecified: Secondary | ICD-10-CM

## 2022-12-29 DIAGNOSIS — Z720 Tobacco use: Secondary | ICD-10-CM

## 2022-12-29 MED ORDER — LEVOFLOXACIN 500 MG PO TABS: 500.0000 mg | ORAL_TABLET | Freq: Every day | ORAL | 0 refills | Status: DC

## 2022-12-29 NOTE — Progress Notes (Signed)
Subjective:  Patient ID: Jerry Gill,  male    DOB: 1961-08-01  Age: 62 y.o.    CC: Sinusitis   HPI Jerry Gill presents for  follow-up of hypertension. Patient has no history of headache chest pain or shortness of breath or recent cough. Patient also denies symptoms of TIA such as numbness weakness lateralizing. Patient denies side effects from medication. States taking it regularly.  Patient also  in for follow-up of elevated cholesterol. Doing well. Declines med. States it doesn't help. Stopped 7-8 years ago. Currently no chest pain, shortness of breath or other cardiovascular related symptoms noted.  C/O sinus. Symptoms include congestion, facial pain, nasal congestion, non productive cough, post nasal drip and sinus pressure. There is no fever, chills, or sweats. Onset of symptoms was a few days ago, gradually worsening since that time.     History Jerry Gill has a past medical history of AAA (abdominal aortic aneurysm), Collagen vascular disease, DDD (degenerative disc disease), lumbar, and Hypertension.   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   His family history includes AAA (abdominal aortic aneurysm) in his father; Cancer in his father; Heart disease in his father and mother; Hyperlipidemia in his brother; Hypertension in his mother.He reports that he has been smoking cigarettes. He has a 23.50 pack-year smoking history. He has never used smokeless tobacco. He reports that he does not drink alcohol and does not use drugs.  Current Outpatient Medications on File Prior to Visit  Medication Sig Dispense Refill   albuterol (VENTOLIN HFA) 108 (90 Base) MCG/ACT inhaler Inhale 2 puffs into the lungs every 4 (four) hours as needed. 18 g 1   aspirin EC 81 MG tablet Take 81 mg by mouth every evening.     azithromycin (ZITHROMAX Z-PAK) 250 MG tablet Take two right away Then one  a day for the next 4 days. 6 each 0   benzonatate (TESSALON) 200 MG capsule Take 1 capsule (200 mg total) by mouth 3 (three) times daily as needed for cough. 20 capsule 0   clobetasol (TEMOVATE) 0.05 % external solution Apply 1 application topically 2 (two) times daily. 50 mL 11   fluticasone (FLONASE) 50 MCG/ACT nasal spray Place 1 spray into both nostrils daily as needed for allergies or rhinitis. (Needs to be seen before next refill) 16 g 0   metoprolol succinate (TOPROL-XL) 100 MG 24 hr tablet Take 1 tablet (100 mg total) by mouth daily. 90 tablet 3   Multiple Vitamins-Minerals (MULTIVITAMIN GUMMIES MENS PO) Take 1 tablet by mouth 2 (two) times daily.     nabumetone (RELAFEN) 500 MG tablet Take 2 tablets (1,000 mg total) by mouth 2 (two) times daily. For muscle and joint pain 360 tablet 1   omeprazole (PRILOSEC) 20 MG capsule Take 1 capsule (20 mg total) by mouth daily. For reflux 90 capsule 3   rosuvastatin (CRESTOR) 20 MG tablet Take 1 tablet (20 mg total) by mouth daily. For cholesterol 90 tablet 1   sildenafil (REVATIO) 20 MG tablet TAKE 2-5 TABLETS AS NEEDED PRIOR TO SEXUAL ACTIVITY 100 tablet 3   tiZANidine (ZANAFLEX) 4 MG tablet Take 1 tablet (4 mg total) by mouth every 6 (six) hours as needed for muscle spasms. 30 tablet 1   No current facility-administered medications on file prior to visit.    ROS Review of Systems  Constitutional:  Negative for activity change, appetite change, chills and fever.  HENT:  Positive for congestion, postnasal drip, rhinorrhea and sinus pressure. Negative for ear discharge, ear pain, hearing loss, nosebleeds, sneezing and trouble swallowing.   Respiratory:  Negative for chest tightness and shortness of breath.   Cardiovascular:  Negative for chest pain and palpitations.  Skin:  Negative for rash.    Objective:  BP 133/82   Pulse 72   Temp 98.4 F (36.9 C)   Ht  (1.803 m)   Wt 182 lb 3.2 oz (82.6 kg)   SpO2 96%   BMI 25.41 kg/m   BP  Readings from Last 3 Encounters:  12/29/22 133/82  10/08/22 (!) 142/84  07/31/22 (!) 164/95    Wt Readings from Last 3 Encounters:  12/29/22 182 lb 3.2 oz (82.6 kg)  12/08/22 187 lb (84.8 kg)  10/08/22 188 lb 6.4 oz (85.5 kg)     Physical Exam Constitutional:      Appearance: He is well-developed.  HENT:     Head: Normocephalic and atraumatic.     Comments: Tender at max sinuses    Right Ear: Tympanic membrane and external ear normal. No decreased hearing noted.     Left Ear: Tympanic membrane and external ear normal. No decreased hearing noted.     Nose: Mucosal edema and congestion present.     Right Sinus: No frontal sinus tenderness.     Left Sinus: No frontal sinus tenderness.     Mouth/Throat:     Mouth: Mucous membranes are moist.     Pharynx: No oropharyngeal exudate or posterior oropharyngeal erythema.  Neck:     Meningeal: Brudzinski's sign absent.  Pulmonary:     Effort: No respiratory distress.     Breath sounds: Normal breath sounds.  Lymphadenopathy:     Head:     Right side of head: No preauricular adenopathy.     Left side of head: No preauricular adenopathy.     Cervical:     Right cervical: No superficial cervical adenopathy.    Left cervical: No superficial cervical adenopathy.     Diabetic Foot Exam - Simple   No data filed     No results found for: "HGBA1C"  Assessment & Plan:   Jerry Gill was seen today for sinusitis.  Diagnoses and all orders for this visit:  Essential hypertension  Acute maxillary sinusitis, recurrence not specified  Tobacco abuse  Other orders -     levofloxacin (LEVAQUIN) 500 MG tablet; Take 1 tablet (500 mg total) by mouth daily.   I am having Jerry Gill start on levofloxacin. I am also having him maintain his aspirin EC, Multiple Vitamins-Minerals (MULTIVITAMIN GUMMIES MENS PO), clobetasol, tiZANidine, nabumetone, albuterol, fluticasone, metoprolol succinate, omeprazole, azithromycin, benzonatate,  rosuvastatin, and sildenafil.  Meds ordered this encounter  Medications   levofloxacin (LEVAQUIN) 500 MG tablet    Sig: Take 1 tablet (500 mg total) by mouth daily.    Dispense:  7 tablet    Refill:  0    Down to one pack a day smoking. Urged to consider quitting.  Follow-up: Return in about 6 months (around 06/30/2023).  Mechele Claude, M.D.

## 2023-03-01 DIAGNOSIS — Y939 Activity, unspecified: Secondary | ICD-10-CM | POA: Diagnosis not present

## 2023-03-01 DIAGNOSIS — R519 Headache, unspecified: Secondary | ICD-10-CM | POA: Diagnosis not present

## 2023-03-01 DIAGNOSIS — J029 Acute pharyngitis, unspecified: Secondary | ICD-10-CM | POA: Diagnosis not present

## 2023-03-01 DIAGNOSIS — W57XXXA Bitten or stung by nonvenomous insect and other nonvenomous arthropods, initial encounter: Secondary | ICD-10-CM | POA: Diagnosis not present

## 2023-03-01 DIAGNOSIS — Z720 Tobacco use: Secondary | ICD-10-CM | POA: Diagnosis not present

## 2023-03-01 DIAGNOSIS — M791 Myalgia, unspecified site: Secondary | ICD-10-CM | POA: Diagnosis not present

## 2023-03-01 DIAGNOSIS — S40869A Insect bite (nonvenomous) of unspecified upper arm, initial encounter: Secondary | ICD-10-CM | POA: Diagnosis not present

## 2023-03-01 DIAGNOSIS — Z1152 Encounter for screening for COVID-19: Secondary | ICD-10-CM | POA: Diagnosis not present

## 2023-03-01 DIAGNOSIS — R059 Cough, unspecified: Secondary | ICD-10-CM | POA: Diagnosis not present

## 2023-03-06 DIAGNOSIS — I10 Essential (primary) hypertension: Secondary | ICD-10-CM | POA: Diagnosis not present

## 2023-03-06 DIAGNOSIS — R0981 Nasal congestion: Secondary | ICD-10-CM | POA: Diagnosis not present

## 2023-03-06 DIAGNOSIS — R059 Cough, unspecified: Secondary | ICD-10-CM | POA: Diagnosis not present

## 2023-03-06 DIAGNOSIS — J019 Acute sinusitis, unspecified: Secondary | ICD-10-CM | POA: Diagnosis not present

## 2023-03-06 DIAGNOSIS — J449 Chronic obstructive pulmonary disease, unspecified: Secondary | ICD-10-CM | POA: Diagnosis not present

## 2023-03-06 DIAGNOSIS — Z720 Tobacco use: Secondary | ICD-10-CM | POA: Diagnosis not present

## 2023-03-23 ENCOUNTER — Telehealth (INDEPENDENT_AMBULATORY_CARE_PROVIDER_SITE_OTHER): Payer: Medicare HMO | Admitting: Family Medicine

## 2023-03-23 ENCOUNTER — Encounter: Payer: Self-pay | Admitting: Family Medicine

## 2023-03-23 DIAGNOSIS — J40 Bronchitis, not specified as acute or chronic: Secondary | ICD-10-CM | POA: Diagnosis not present

## 2023-03-23 DIAGNOSIS — F17210 Nicotine dependence, cigarettes, uncomplicated: Secondary | ICD-10-CM | POA: Diagnosis not present

## 2023-03-23 DIAGNOSIS — Z720 Tobacco use: Secondary | ICD-10-CM

## 2023-03-23 MED ORDER — PREDNISONE 20 MG PO TABS
ORAL_TABLET | ORAL | 0 refills | Status: DC
Start: 2023-03-23 — End: 2023-04-08

## 2023-03-23 MED ORDER — PROMETHAZINE-DM 6.25-15 MG/5ML PO SYRP: 2.5000 mL | ORAL_SOLUTION | Freq: Four times a day (QID) | ORAL | 0 refills | Status: DC | PRN

## 2023-03-23 MED ORDER — ALBUTEROL SULFATE HFA 108 (90 BASE) MCG/ACT IN AERS: 2.0000 | INHALATION_SPRAY | RESPIRATORY_TRACT | 1 refills | Status: AC | PRN

## 2023-03-23 MED ORDER — BREZTRI AEROSPHERE 160-9-4.8 MCG/ACT IN AERO
2.00 | INHALATION_SPRAY | Freq: Two times a day (BID) | RESPIRATORY_TRACT | 11 refills | Status: AC
Start: 2023-03-23 — End: ?

## 2023-03-23 NOTE — Patient Instructions (Signed)
- start Breztri today.  2 puffs twice daily (roughly every 12 hours). Rinse mouth after each use - I've referred you for that lung scan.  You will get a call to schedule - Follow up with Dr Darlyn Read to let him now how samples went and if you want a prescription sent to pharmacy.  If med is super costly, Dr Darlyn Read can connect you with someone in our office that can help you get it cheaper/ for free - Start Prednisone with breakfast tomorrow - Use albuterol if needed but hopefully, the samples will work well so you don't need it.  Acute Bronchitis, Adult  Acute bronchitis is when air tubes in the lungs (bronchi) suddenly get swollen. The condition can make it hard for you to breathe. In adults, acute bronchitis usually goes away within 2 weeks. A cough caused by bronchitis may last up to 3 weeks. Smoking, allergies, and asthma can make the condition worse. What are the causes? Germs that cause cold and flu (viruses). The most common cause of this condition is the virus that causes the common cold. Bacteria. Substances that bother (irritate) the lungs, including: Smoke from cigarettes and other types of tobacco. Dust and pollen. Fumes from chemicals, gases, or burned fuel. Indoor or outdoor air pollution. What increases the risk? A weak body's defense system. This is also called the immune system. Any condition that affects your lungs and breathing, such as asthma. What are the signs or symptoms? A cough. Coughing up clear, yellow, or green mucus. Making high-pitched whistling sounds when you breathe, most often when you breathe out (wheezing). Runny or stuffy nose. Having too much mucus in your lungs (chest congestion). Shortness of breath. Body aches. A sore throat. How is this treated? Acute bronchitis may go away over time without treatment. Your doctor may tell you to: Drink more fluids. This will help thin your mucus so it is easier to cough up. Use a device that gets medicine into  your lungs (inhaler). Use a vaporizer or a humidifier. These are machines that add water to the air. This helps with coughing and poor breathing. Take a medicine that thins mucus and helps clear it from your lungs. Take a medicine that prevents or stops coughing. It is not common to take an antibiotic medicine for this condition. Follow these instructions at home:  Take over-the-counter and prescription medicines only as told by your doctor. Use an inhaler, vaporizer, or humidifier as told by your doctor. Take two teaspoons (10 mL) of honey at bedtime. This helps lessen your coughing at night. Drink enough fluid to keep your pee (urine) pale yellow. Do not smoke or use any products that contain nicotine or tobacco. If you need help quitting, ask your doctor. Get a lot of rest. Return to your normal activities when your doctor says that it is safe. Keep all follow-up visits. How is this prevented?  Wash your hands often with soap and water for at least 20 seconds. If you cannot use soap and water, use hand sanitizer. Avoid contact with people who have cold symptoms. Try not to touch your mouth, nose, or eyes with your hands. Avoid breathing in smoke or chemical fumes. Make sure to get the flu shot every year. Contact a doctor if: Your symptoms do not get better in 2 weeks. You have trouble coughing up the mucus. Your cough keeps you awake at night. You have a fever. Get help right away if: You cough up blood. You have chest  pain. You have very bad shortness of breath. You faint or keep feeling like you are going to faint. You have a very bad headache. Your fever or chills get worse. These symptoms may be an emergency. Get help right away. Call your local emergency services (911 in the [place] Do not wait to see if the symptoms will go away. Do not drive yourself to the hospital. Summary Acute bronchitis is when air tubes in the lungs (bronchi) suddenly get swollen. In adults,  acute bronchitis usually goes away within 2 weeks. Drink more fluids. This will help thin your mucus so it is easier to cough up. Take over-the-counter and prescription medicines only as told by your doctor. Contact a doctor if your symptoms do not improve after 2 weeks of treatment. This information is not intended to replace advice given to you by your health care provider. Make sure you discuss any questions you have with your health care provider. Document Revised: 01/01/2021 Document Reviewed: 01/01/2021 Elsevier Patient Education  2024 [employer].

## 2023-03-23 NOTE — Progress Notes (Signed)
MyChart Video visit  Subjective: CC:URi PCP: Mechele Claude, MD [id number] Jerry Gill is a 62 y.o. male. Patient provides verbal consent for consult held via video.  Due to COVID-19 pandemic this visit was conducted virtually. This visit type was conducted due to national recommendations for restrictions regarding the COVID-19 Pandemic (e.g. social distancing, sheltering in place) in an effort to limit this patient's exposure and mitigate transmission in our community. All issues noted in this document were discussed and addressed.  A physical exam was not performed with this format.   Location of patient: home Location of provider: [facility] Others present for call: spouse  1. URI Patient reports stuffy nose and persistent cough.  Just finished abx last Wednesday given to him [facility].  He just finished zpak, tessalon perles, Mucinex. Smokes 1 ppd and has done at least 1-2 ppd since age 43.  Never had CT lung but would be interested in this.  Never diagnosed with COPD or any lung disease to his knowledge.     ROS: Per HPI  Allergies  Allergen Reactions   Codeine Nausea And Vomiting   Penicillins Itching and Rash    Has patient had a PCN reaction causing immediate rash, facial/tongue/throat swelling, SOB or lightheadedness with hypotension: Unknown, childhood reaction Has patient had a PCN reaction causing severe rash involving mucus membranes or skin necrosis: No Has patient had a PCN reaction that required hospitalization No Has patient had a PCN reaction occurring within the last 10 years: No If all of the above answers are "NO", then may proceed with Cephalosporin use.    Sulfa Antibiotics Itching   Past Medical History:  Diagnosis Date   AAA (abdominal aortic aneurysm) (HCC)    Collagen vascular disease (HCC)    DDD (degenerative disc disease), lumbar    Hypertension     Current Outpatient Medications:    albuterol (VENTOLIN HFA) 108 (90 Base) MCG/ACT inhaler, Inhale 2 puffs into  the lungs every 4 (four) hours as needed., Disp: 18 g, Rfl: 1   aspirin EC 81 MG tablet, Take 81 mg by mouth every evening., Disp: , Rfl:    azithromycin (ZITHROMAX Z-PAK) 250 MG tablet, Take two right away Then one a day for the next 4 days., Disp: 6 each, Rfl: 0   benzonatate (TESSALON) 200 MG capsule, Take 1 capsule (200 mg total) by mouth 3 (three) times daily as needed for cough., Disp: 20 capsule, Rfl: 0   clobetasol (TEMOVATE) 0.05 % external solution, Apply 1 application topically 2 (two) times daily., Disp: 50 mL, Rfl: 11   fluticasone (FLONASE) 50 MCG/ACT nasal spray, Place 1 spray into both nostrils daily as needed for allergies or rhinitis. (Needs to be seen before next refill), Disp: 16 g, Rfl: 0   levofloxacin (LEVAQUIN) 500 MG tablet, Take 1 tablet (500 mg total) by mouth daily., Disp: 7 tablet, Rfl: 0   metoprolol succinate (TOPROL-XL) 100 MG 24 hr tablet, Take 1 tablet (100 mg total) by mouth daily., Disp: 90 tablet, Rfl: 3   Multiple Vitamins-Minerals (MULTIVITAMIN GUMMIES MENS PO), Take 1 tablet by mouth 2 (two) times daily., Disp: , Rfl:    nabumetone (RELAFEN) 500 MG tablet, Take 2 tablets (1,000 mg total) by mouth 2 (two) times daily. For muscle and joint pain, Disp: 360 tablet, Rfl: 1   omeprazole (PRILOSEC) 20 MG capsule, Take 1 capsule (20 mg total) by mouth daily. For reflux, Disp: 90 capsule, Rfl: 3   rosuvastatin (CRESTOR) 20 MG tablet, Take 1 tablet (20  mg total) by mouth daily. For cholesterol, Disp: 90 tablet, Rfl: 1   sildenafil (REVATIO) 20 MG tablet, TAKE 2-5 TABLETS AS NEEDED PRIOR TO SEXUAL ACTIVITY, Disp: 100 tablet, Rfl: 3   tiZANidine (ZANAFLEX) 4 MG tablet, Take 1 tablet (4 mg total) by mouth every 6 (six) hours as needed for muscle spasms., Disp: 30 tablet, Rfl: 1  Gen: nontoxic male, NAD Pulm: coughing intermittently, but no dyspnea with speech. Normal WOB on room air.  Assessment/ Plan: 62 y.o. male   Bronchitis - Plan: albuterol (VENTOLIN HFA) 108 (90  Base) MCG/ACT inhaler, promethazine-dextromethorphan (PROMETHAZINE-DM) 6.25-15 MG/5ML syrup, predniSONE (DELTASONE) 20 MG tablet, Budeson-Glycopyrrol-Formoterol (BREZTRI AEROSPHERE) 160-9-4.8 MCG/ACT AERO  Tobacco abuse - Plan: Ambulatory Referral Lung Cancer Screening [facility], Budeson-Glycopyrrol-Formoterol (BREZTRI AEROSPHERE) 160-9-4.8 MCG/ACT AERO  Heavy smoker (more than 20 cigarettes per day) - Plan: Ambulatory Referral Lung Cancer Screening [facility], Budeson-Glycopyrrol-Formoterol (BREZTRI AEROSPHERE) 160-9-4.8 MCG/ACT AERO  I strongly question undiagnosed emphysema versus COPD given this long term smoker with recurrent URI and bronchitis.  I am placing him on an oral prednisone burst to start with tomorrow.  Promethazine with dextromethorphan as needed cough.  Breztri samples provided upfront and we discussed using 2 puffs twice daily and rinsing mouth out after each use.  He has close follow-up with PCP in about 2-1/2 weeks and I advised him to discuss with him if symptoms are improving with rest recently he may prescribe accordingly.  Albuterol inhaler also prescribed for as needed use.  Given recent completion of oral antibiotic I did not feel that this was needed at this time.  I have gone ahead and referred him to [facility] lung cancer screening as he certainly qualifies given his greater than 40-pack-year history of smoking   Start time: 1:05pm End time: 1:18pm  Total time spent on patient care (including video visit/ documentation): 16 minutes  Orla Estrin Hulen Skains, DO [facility] [phone]

## 2023-04-08 ENCOUNTER — Encounter: Payer: Self-pay | Admitting: Family Medicine

## 2023-04-08 ENCOUNTER — Ambulatory Visit (INDEPENDENT_AMBULATORY_CARE_PROVIDER_SITE_OTHER): Payer: Medicare HMO | Admitting: Family Medicine

## 2023-04-08 VITALS — BP 123/73 | HR 80 | Temp 97.9°F | Ht 71.0 in | Wt 180.0 lb

## 2023-04-08 DIAGNOSIS — E785 Hyperlipidemia, unspecified: Secondary | ICD-10-CM

## 2023-04-08 DIAGNOSIS — F17200 Nicotine dependence, unspecified, uncomplicated: Secondary | ICD-10-CM

## 2023-04-08 DIAGNOSIS — I10 Essential (primary) hypertension: Secondary | ICD-10-CM | POA: Diagnosis not present

## 2023-04-08 DIAGNOSIS — F17210 Nicotine dependence, cigarettes, uncomplicated: Secondary | ICD-10-CM

## 2023-04-08 DIAGNOSIS — Z720 Tobacco use: Secondary | ICD-10-CM

## 2023-04-08 MED ORDER — NABUMETONE 500 MG PO TABS: 1000.0000 mg | ORAL_TABLET | Freq: Two times a day (BID) | ORAL | 3 refills | Status: DC

## 2023-04-08 MED ORDER — ROSUVASTATIN CALCIUM 20 MG PO TABS: 20.0000 mg | ORAL_TABLET | Freq: Every day | ORAL | 3 refills | Status: DC

## 2023-04-08 MED ORDER — FLUTICASONE PROPIONATE 50 MCG/ACT NA SUSP: 2.0000 | Freq: Every day | NASAL | 6 refills | Status: DC

## 2023-04-08 MED ORDER — FEXOFENADINE HCL 180 MG PO TABS: 180.0000 mg | ORAL_TABLET | Freq: Every day | ORAL | 11 refills | Status: AC

## 2023-04-08 NOTE — Progress Notes (Signed)
Subjective:  Patient ID: Jerry Gill, male    DOB: 1960-10-08  Age: 62 y.o. MRN: [id number]  CC: Medical Management of Chronic Issues   HPI Melton Walls presents for elevated cholesterol. Not taking cholesterol med. Using a low fat diet. Due for recheck of lab,   presents for  follow-up of hypertension. Patient has no history of headache chest pain or shortness of breath or recent cough. Patient also denies symptoms of TIA such as focal numbness or weakness. Patient denies side effects from medication. States taking it regularly.  Still smoking. Some cough & DOE.    04/08/2023    1:04 PM 12/29/2022    2:43 PM 12/08/2022    1:28 PM  Depression screen PHQ 2/9  Decreased Interest 0 0 0  Down, Depressed, Hopeless 0 0 0  PHQ - 2 Score 0 0 0    History Charels has a past medical history of AAA (abdominal aortic aneurysm) (HCC), Collagen vascular disease (HCC), DDD (degenerative disc disease), lumbar, and Hypertension.   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   His family history includes AAA (abdominal aortic aneurysm) in his father; Cancer in his father; Heart disease in his father and mother; Hyperlipidemia in his brother; Hypertension in his mother.He reports that he has been smoking cigarettes. He has a 23.5 pack-year smoking history. He has never used smokeless tobacco. He reports that he does not drink alcohol and does not use drugs.    ROS Review of Systems  Constitutional:  Negative for fever.  Respiratory:  Negative for shortness of breath.   Cardiovascular:  Negative for chest pain.  Musculoskeletal:  Negative for arthralgias.  Skin:  Negative for rash.    Objective:  BP 123/73   Pulse 80   Temp 97.9 F (36.6 C)   Ht 5\' 11"  (1.803 m)   Wt 180 lb (81.6 kg)   SpO2 93%   BMI 25.10 kg/m   BP Readings from Last 3 Encounters:  04/08/23 123/73  12/29/22  133/82  10/08/22 (!) 142/84    Wt Readings from Last 3 Encounters:  04/08/23 180 lb (81.6 kg)  12/29/22 182 lb 3.2 oz (82.6 kg)  12/08/22 187 lb (84.8 kg)     Physical Exam Vitals reviewed.  Constitutional:      Appearance: He is well-developed.  HENT:     Head: Normocephalic and atraumatic.     Right Ear: External ear normal.     Left Ear: External ear normal.     Mouth/Throat:     Pharynx: No oropharyngeal exudate or posterior oropharyngeal erythema.  Eyes:     Pupils: Pupils are equal, round, and reactive to light.  Cardiovascular:     Rate and Rhythm: Normal rate and regular rhythm.     Heart sounds: No murmur heard. Pulmonary:     Effort: No respiratory distress.     Breath sounds: Normal breath sounds.  Musculoskeletal:     Cervical back: Normal range of motion and neck supple.  Neurological:     Mental Status: He is alert and oriented to person, place, and time.       Assessment & Plan:   Kaliq was seen today for medical management of chronic issues.  Diagnoses and all orders for this visit:  Essential hypertension -     CBC with Differential/Platelet -     CMP14+EGFR  Hyperlipidemia, unspecified hyperlipidemia type -  Lipid panel  Encounter for screening for malignant neoplasm of lung in current smoker with 30 pack year history or greater -     Ambulatory Referral for Lung Cancer Scre  Tobacco abuse  Other orders -     nabumetone (RELAFEN) 500 MG tablet; Take 2 tablets (1,000 mg total) by mouth 2 (two) times daily. For muscle and joint pain -     rosuvastatin (CRESTOR) 20 MG tablet; Take 1 tablet (20 mg total) by mouth daily. For cholesterol -     fexofenadine (ALLEGRA) 180 MG tablet; Take 1 tablet (180 mg total) by mouth daily. For allergy symptoms -     fluticasone (FLONASE) 50 MCG/ACT nasal spray; Place 2 sprays into both nostrils daily.       I have discontinued Paiton Harries's benzonatate, promethazine-dextromethorphan, and  predniSONE. I am also having him start on fexofenadine and fluticasone. Additionally, I am having him maintain his aspirin EC, Multiple Vitamins-Minerals (MULTIVITAMIN GUMMIES MENS PO), clobetasol, tiZANidine, fluticasone, metoprolol succinate, omeprazole, sildenafil, albuterol, Breztri Aerosphere, nabumetone, and rosuvastatin.  Allergies as of 04/08/2023       Reactions   Levaquin [levofloxacin]    nausea   Codeine Nausea And Vomiting   Penicillins Itching, Rash   Has patient had a PCN reaction causing immediate rash, facial/tongue/throat swelling, SOB or lightheadedness with hypotension: Unknown, childhood reaction Has patient had a PCN reaction causing severe rash involving mucus membranes or skin necrosis: No Has patient had a PCN reaction that required hospitalization No Has patient had a PCN reaction occurring within the last 10 years: No If all of the above answers are "NO", then may proceed with Cephalosporin use.   Sulfa Antibiotics Itching        Medication List        Accurate as of April 08, 2023 11:59 PM. If you have any questions, ask your nurse or doctor.          STOP taking these medications    benzonatate 200 MG capsule Commonly known as: TESSALON Stopped by: Keilana Morlock   predniSONE 20 MG tablet Commonly known as: DELTASONE Stopped by: Broadus John Dalyn Becker   promethazine-dextromethorphan 6.25-15 MG/5ML syrup Commonly known as: PROMETHAZINE-DM Stopped by: Coley Kulikowski       TAKE these medications    albuterol 108 (90 Base) MCG/ACT inhaler Commonly known as: VENTOLIN HFA Inhale 2 puffs into the lungs every 4 (four) hours as needed.   aspirin EC 81 MG tablet Take 81 mg by mouth every evening.   Breztri Aerosphere 160-9-4.8 MCG/ACT Aero Generic drug: Budeson-Glycopyrrol-Formoterol Inhale 2 puffs into the lungs 2 (two) times daily.   clobetasol 0.05 % external solution Commonly known as: TEMOVATE Apply 1 application topically 2 (two) times daily.    fexofenadine 180 MG tablet Commonly known as: ALLEGRA Take 1 tablet (180 mg total) by mouth daily. For allergy symptoms Started by: Dessa Ledee   fluticasone 50 MCG/ACT nasal spray Commonly known as: FLONASE Place 1 spray into both nostrils daily as needed for allergies or rhinitis. (Needs to be seen before next refill) What changed: Another medication with the same name was added. Make sure you understand how and when to take each. Changed by: Jacobi Ryant   fluticasone 50 MCG/ACT nasal spray Commonly known as: FLONASE Place 2 sprays into both nostrils daily. What changed: You were already taking a medication with the same name, and this prescription was added. Make sure you understand how and when to take each. Changed by: [employer]  metoprolol succinate 100 MG 24 hr tablet Commonly known as: TOPROL-XL Take 1 tablet (100 mg total) by mouth daily.   MULTIVITAMIN GUMMIES MENS PO Take 1 tablet by mouth 2 (two) times daily.   nabumetone 500 MG tablet Commonly known as: RELAFEN Take 2 tablets (1,000 mg total) by mouth 2 (two) times daily. For muscle and joint pain   omeprazole 20 MG capsule Commonly known as: PRILOSEC Take 1 capsule (20 mg total) by mouth daily. For reflux   rosuvastatin 20 MG tablet Commonly known as: Crestor Take 1 tablet (20 mg total) by mouth daily. For cholesterol   sildenafil 20 MG tablet Commonly known as: REVATIO TAKE 2-5 TABLETS AS NEEDED PRIOR TO SEXUAL ACTIVITY   tiZANidine 4 MG tablet Commonly known as: ZANAFLEX Take 1 tablet (4 mg total) by mouth every 6 (six) hours as needed for muscle spasms.         Follow-up: Return soon, to freeze warts.  Mechele Claude, M.D.

## 2023-04-09 LAB — LIPID PANEL: HDL: 25 mg/dL — ABNORMAL LOW (ref >39)

## 2023-04-09 LAB — CBC WITH DIFFERENTIAL/PLATELET: Platelets: 273 10*3/uL (ref 150–450)

## 2023-04-11 ENCOUNTER — Encounter: Payer: Self-pay | Admitting: Family Medicine

## 2023-04-11 DIAGNOSIS — R0989 Other specified symptoms and signs involving the circulatory and respiratory systems: Secondary | ICD-10-CM | POA: Diagnosis not present

## 2023-04-11 DIAGNOSIS — R059 Cough, unspecified: Secondary | ICD-10-CM | POA: Diagnosis not present

## 2023-04-11 DIAGNOSIS — J069 Acute upper respiratory infection, unspecified: Secondary | ICD-10-CM | POA: Diagnosis not present

## 2023-04-11 DIAGNOSIS — Z720 Tobacco use: Secondary | ICD-10-CM | POA: Diagnosis not present

## 2023-04-11 DIAGNOSIS — J449 Chronic obstructive pulmonary disease, unspecified: Secondary | ICD-10-CM | POA: Diagnosis not present

## 2023-04-11 DIAGNOSIS — I10 Essential (primary) hypertension: Secondary | ICD-10-CM | POA: Diagnosis not present

## 2023-04-12 ENCOUNTER — Other Ambulatory Visit: Payer: Self-pay | Admitting: *Deleted

## 2023-04-12 MED ORDER — ROSUVASTATIN CALCIUM 20 MG PO TABS: 20.0000 mg | ORAL_TABLET | Freq: Every day | ORAL | 3 refills | Status: DC

## 2023-06-22 ENCOUNTER — Other Ambulatory Visit: Payer: Self-pay | Admitting: Family Medicine

## 2023-06-22 DIAGNOSIS — I10 Essential (primary) hypertension: Secondary | ICD-10-CM

## 2023-06-30 ENCOUNTER — Ambulatory Visit: Payer: Medicare HMO | Admitting: Family Medicine

## 2023-07-01 ENCOUNTER — Encounter: Payer: Self-pay | Admitting: Family Medicine

## 2023-07-06 DIAGNOSIS — H5203 Hypermetropia, bilateral: Secondary | ICD-10-CM | POA: Diagnosis not present

## 2023-07-06 DIAGNOSIS — H524 Presbyopia: Secondary | ICD-10-CM | POA: Diagnosis not present

## 2023-07-06 DIAGNOSIS — H52209 Unspecified astigmatism, unspecified eye: Secondary | ICD-10-CM | POA: Diagnosis not present

## 2023-07-10 DIAGNOSIS — R059 Cough, unspecified: Secondary | ICD-10-CM | POA: Diagnosis not present

## 2023-07-10 DIAGNOSIS — J449 Chronic obstructive pulmonary disease, unspecified: Secondary | ICD-10-CM | POA: Diagnosis not present

## 2023-07-10 DIAGNOSIS — I10 Essential (primary) hypertension: Secondary | ICD-10-CM | POA: Diagnosis not present

## 2023-07-10 DIAGNOSIS — J329 Chronic sinusitis, unspecified: Secondary | ICD-10-CM | POA: Diagnosis not present

## 2023-07-10 DIAGNOSIS — J189 Pneumonia, unspecified organism: Secondary | ICD-10-CM | POA: Diagnosis not present

## 2023-07-10 DIAGNOSIS — Z720 Tobacco use: Secondary | ICD-10-CM | POA: Diagnosis not present

## 2023-08-03 DIAGNOSIS — R059 Cough, unspecified: Secondary | ICD-10-CM | POA: Diagnosis not present

## 2023-08-03 DIAGNOSIS — R531 Weakness: Secondary | ICD-10-CM | POA: Diagnosis not present

## 2023-08-03 DIAGNOSIS — J029 Acute pharyngitis, unspecified: Secondary | ICD-10-CM | POA: Diagnosis not present

## 2023-08-03 DIAGNOSIS — Z720 Tobacco use: Secondary | ICD-10-CM | POA: Diagnosis not present

## 2023-08-03 DIAGNOSIS — R0989 Other specified symptoms and signs involving the circulatory and respiratory systems: Secondary | ICD-10-CM | POA: Diagnosis not present

## 2023-08-03 DIAGNOSIS — U071 COVID-19: Secondary | ICD-10-CM | POA: Diagnosis not present

## 2023-09-14 ENCOUNTER — Other Ambulatory Visit: Payer: Self-pay | Admitting: Family Medicine

## 2023-09-14 DIAGNOSIS — I10 Essential (primary) hypertension: Secondary | ICD-10-CM

## 2023-10-12 ENCOUNTER — Telehealth: Payer: Self-pay

## 2023-10-12 DIAGNOSIS — Z122 Encounter for screening for malignant neoplasm of respiratory organs: Secondary | ICD-10-CM

## 2023-10-12 DIAGNOSIS — F17210 Nicotine dependence, cigarettes, uncomplicated: Secondary | ICD-10-CM

## 2023-10-12 DIAGNOSIS — Z87891 Personal history of nicotine dependence: Secondary | ICD-10-CM

## 2023-10-12 NOTE — Telephone Encounter (Signed)
.  Lung Cancer Screening Narrative/Criteria Questionnaire (Cigarette Smokers Only- No Cigars/Pipes/vapes)   Jerry Gill   SDMV:10/18/2023 at 2:00pm with Keturah Barre, RN   09/16/1960   LDCT: 10/26/2023 at 1:30pm at [facility]    62 y.o.   Phone: [phone]  Lung Screening Narrative (confirm age 20-77 yrs Medicare / 50-80 yrs Private pay insurance)   Insurance information:[employer]   Referring Provider: Nadine Counts     This screening involves an initial phone call with a team member from our program. It is called a shared decision making visit. The initial meeting is required by  insurance and Medicare to make sure you understand the program. This appointment takes about 15-20 minutes to complete. You will complete the screening scan at your scheduled date/time.  This scan takes about 5-10 minutes to complete. You can eat and drink normally before and after the scan.  Criteria questions for Lung Cancer Screening:   Are you a current or former smoker? Current Age began smoking: 7   If you are a former smoker, what year did you quit smoking?(within 15 yrs)   To calculate your smoking history, I need an accurate estimate of how many packs of cigarettes you smoked per day and for how many years. (Not just the number of PPD you are now smoking)   Years smoking 55 x Packs per day 3 = Pack years 165   (at least 20 pack yrs)   (Make sure they understand that we need to know how much they have smoked in the past, not just the number of PPD they are smoking now)  Do you have a personal history of cancer?  No    Do you have a family history of cancer? Yes  (cancer type and and relative) Father prostate Aunt pancreas  Are you coughing up blood?  No  Have you had unexplained weight loss of 15 lbs or more in the last 6 months? No  It looks like you meet all criteria.  When would be a good time for [place] to schedule you for this screening?   Additional information:

## 2023-10-18 ENCOUNTER — Ambulatory Visit (INDEPENDENT_AMBULATORY_CARE_PROVIDER_SITE_OTHER): Payer: Medicare HMO | Admitting: Acute Care

## 2023-10-18 DIAGNOSIS — F17210 Nicotine dependence, cigarettes, uncomplicated: Secondary | ICD-10-CM | POA: Diagnosis not present

## 2023-10-18 NOTE — Patient Instructions (Signed)

## 2023-10-18 NOTE — Progress Notes (Signed)
 Provider Attestation I agree with the documentation of the Shared Decision Making visit,  smoking cessation counseling if appropriate, and verification or eligibility for lung cancer screening as documented by the RN Nurse Navigator.   Raejean Bullock, MSN, AGACNP-BC [facility] See Amion for personal pager PCCM on call pager [phone]    Virtual Visit via Telephone Note  I connected with Jerry Gill on 10/18/23 at  2:00 PM EST by telephone and verified that I am speaking with the correct person using two identifiers.  Location: Patient: in home Provider: [address], [place], [place], [address]  Shared Decision Making Visit Lung Cancer Screening Program [phone])   Eligibility: Age 63 y.o. Pack Years Smoking History Calculation 165 (# packs/per year x # years smoked) Recent History of coughing up blood  no Unexplained weight loss? no ( >Than 15 pounds within the last 6 months ) Prior History Lung / other cancer no (Diagnosis within the last 5 years already requiring surveillance chest CT Scans). Smoking Status Current Smoker Former Smokers: Years since quit:  NA  Quit Date: NA  Visit Components: Discussion included one or more decision making aids. yes Discussion included risk/benefits of screening. yes Discussion included potential follow up diagnostic testing for abnormal scans. yes Discussion included meaning and risk of over diagnosis. yes Discussion included meaning and risk of False Positives. yes Discussion included meaning of total radiation exposure. yes  Counseling Included: Importance of adherence to annual lung cancer LDCT screening. yes Impact of comorbidities on ability to participate in the program. yes Ability and willingness to under diagnostic treatment. yes  Smoking Cessation Counseling: Current Smokers:  Discussed importance of smoking cessation. yes Information about tobacco cessation classes and  interventions provided to patient. yes Patient provided with "ticket" for LDCT Scan. yes Symptomatic Patient. no  Counseling NA Diagnosis Code: Tobacco Use Z72.0 Asymptomatic Patient yes  Counseling (Intermediate counseling: > three minutes counseling) [id number] Former Smokers:  Discussed the importance of maintaining cigarette abstinence. yes Diagnosis Code: Personal History of Nicotine Dependence. W10.272 Information about tobacco cessation classes and interventions provided to patient. Yes Patient provided with "ticket" for LDCT Scan. yes Written Order for Lung Cancer Screening with LDCT placed in Epic. Yes (CT Chest Lung Cancer Screening Low Dose W/O CM) [id number] Z12.2-Screening of respiratory organs Z87.891-Personal history of nicotine dependence   Valentin Gaskins, RN 10/18/23

## 2023-10-26 ENCOUNTER — Ambulatory Visit (HOSPITAL_COMMUNITY)
Admission: RE | Admit: 2023-10-26 | Discharge: 2023-10-26 | Disposition: A | Discharge status: Home / Self Care | Payer: Medicare HMO | Source: Ambulatory Visit | Attending: Acute Care | Admitting: Acute Care

## 2023-10-26 DIAGNOSIS — F17210 Nicotine dependence, cigarettes, uncomplicated: Secondary | ICD-10-CM | POA: Diagnosis not present

## 2023-10-26 DIAGNOSIS — Z87891 Personal history of nicotine dependence: Secondary | ICD-10-CM | POA: Insufficient documentation

## 2023-10-26 DIAGNOSIS — Z122 Encounter for screening for malignant neoplasm of respiratory organs: Secondary | ICD-10-CM | POA: Diagnosis not present

## 2023-11-09 ENCOUNTER — Telehealth: Payer: Self-pay | Admitting: *Deleted

## 2023-11-09 NOTE — Telephone Encounter (Signed)
 Call report from [facility] Radiology:  IMPRESSION: 1. Lung-RADS 4B, suspicious. Additional imaging evaluation or consultation with Pulmonology or Thoracic Surgery recommended. Posterior right upper lobe 2.6 cm density could represent an area of scarring, given volume loss. This is indeterminate, and neoplasm cannot be excluded. Consider further evaluation with PET or chest CT follow-up at 3 months. In addition, if there are outside chest CTs, these should be reviewed. This area is not readily apparent on prior radiographs. 2. Aortic atherosclerosis (ICD10-I70.0), coronary artery atherosclerosis and emphysema (ICD10-J43.9). 3. Right adrenal adenoma . In the absence of clinically indicated signs/symptoms require(s) no independent follow-up.

## 2023-11-11 ENCOUNTER — Other Ambulatory Visit: Payer: Self-pay | Admitting: Acute Care

## 2023-11-11 ENCOUNTER — Telehealth: Payer: Self-pay | Admitting: Acute Care

## 2023-11-11 DIAGNOSIS — R911 Solitary pulmonary nodule: Secondary | ICD-10-CM

## 2023-11-11 NOTE — Progress Notes (Unsigned)
 I have called the patient with the results of his low Dose Ct Chest. His scan was read as a LR 4 B. There is a 25.6 mm right upper lobe/ right apical soft tissue density. We discussed that we could do a repeat scan in 3 months to re-evaluate the area, or we could do a PET scan now. He opted for the PET scan now.  I have ordered the scan, please fax results to PCP and let them know plan of care . Pt. Will need follow up with me in the office within 1 week of scan completion to review results. Based on PET results we will determine next best steps in plan of care. Thanks so much

## 2023-11-11 NOTE — Telephone Encounter (Signed)
 See telephone note from 11/11/23

## 2023-11-11 NOTE — Telephone Encounter (Signed)
Results/ plans faxed to PCP. Will await PET results.  

## 2023-11-11 NOTE — Telephone Encounter (Signed)
 I have called the patient with the results of his low Dose Ct Chest. His scan was read as a LR 4 B. There is a 25.6 mm right upper lobe/ right apical soft tissue density. We discussed that we could do a repeat scan in 3 months to re-evaluate the area, or we could do a PET scan now. He opted for the PET scan now. He had no further questions at completion of the call.  I have ordered the scan, please fax results to PCP and let them know plan of care . Pt. Will need follow up with me in the office within 1 week of scan completion to review results. Based on PET results we will determine next best steps in plan of care. Thanks so much

## 2023-11-18 ENCOUNTER — Ambulatory Visit (HOSPITAL_COMMUNITY)
Admission: RE | Admit: 2023-11-18 | Discharge: 2023-11-18 | Disposition: A | Discharge status: Home / Self Care | Source: Ambulatory Visit | Attending: Acute Care | Admitting: Acute Care

## 2023-11-18 DIAGNOSIS — X500XXA Overexertion from strenuous movement or load, initial encounter: Secondary | ICD-10-CM | POA: Diagnosis not present

## 2023-11-18 DIAGNOSIS — S39012A Strain of muscle, fascia and tendon of lower back, initial encounter: Secondary | ICD-10-CM | POA: Diagnosis not present

## 2023-11-18 DIAGNOSIS — Y999 Unspecified external cause status: Secondary | ICD-10-CM | POA: Diagnosis not present

## 2023-11-18 DIAGNOSIS — I10 Essential (primary) hypertension: Secondary | ICD-10-CM | POA: Diagnosis not present

## 2023-11-18 DIAGNOSIS — Y939 Activity, unspecified: Secondary | ICD-10-CM | POA: Diagnosis not present

## 2023-11-18 DIAGNOSIS — R911 Solitary pulmonary nodule: Secondary | ICD-10-CM | POA: Insufficient documentation

## 2023-11-18 DIAGNOSIS — Z720 Tobacco use: Secondary | ICD-10-CM | POA: Diagnosis not present

## 2023-11-18 DIAGNOSIS — Y929 Unspecified place or not applicable: Secondary | ICD-10-CM | POA: Diagnosis not present

## 2023-11-18 MED ORDER — FLUDEOXYGLUCOSE F - 18 (FDG) INJECTION
8.5800 | Freq: Once | INTRAVENOUS | Status: AC | PRN
  Administered 2023-11-18: 8.58 via INTRAVENOUS

## 2023-11-24 ENCOUNTER — Telehealth: Payer: Self-pay

## 2023-11-25 NOTE — Telephone Encounter (Signed)
 Error

## 2023-11-30 DIAGNOSIS — I714 Abdominal aortic aneurysm, without rupture, unspecified: Secondary | ICD-10-CM | POA: Diagnosis not present

## 2023-11-30 DIAGNOSIS — S39012A Strain of muscle, fascia and tendon of lower back, initial encounter: Secondary | ICD-10-CM | POA: Diagnosis not present

## 2023-11-30 DIAGNOSIS — Y929 Unspecified place or not applicable: Secondary | ICD-10-CM | POA: Diagnosis not present

## 2023-11-30 DIAGNOSIS — Y999 Unspecified external cause status: Secondary | ICD-10-CM | POA: Diagnosis not present

## 2023-11-30 DIAGNOSIS — X501XXA Overexertion from prolonged static or awkward postures, initial encounter: Secondary | ICD-10-CM | POA: Diagnosis not present

## 2023-11-30 DIAGNOSIS — Z720 Tobacco use: Secondary | ICD-10-CM | POA: Diagnosis not present

## 2023-11-30 DIAGNOSIS — Y939 Activity, unspecified: Secondary | ICD-10-CM | POA: Diagnosis not present

## 2023-11-30 DIAGNOSIS — J329 Chronic sinusitis, unspecified: Secondary | ICD-10-CM | POA: Diagnosis not present

## 2023-11-30 DIAGNOSIS — I10 Essential (primary) hypertension: Secondary | ICD-10-CM | POA: Diagnosis not present

## 2023-12-01 DIAGNOSIS — M47817 Spondylosis without myelopathy or radiculopathy, lumbosacral region: Secondary | ICD-10-CM | POA: Diagnosis not present

## 2023-12-01 DIAGNOSIS — I10 Essential (primary) hypertension: Secondary | ICD-10-CM | POA: Diagnosis not present

## 2023-12-01 DIAGNOSIS — Z720 Tobacco use: Secondary | ICD-10-CM | POA: Diagnosis not present

## 2023-12-01 DIAGNOSIS — M5442 Lumbago with sciatica, left side: Secondary | ICD-10-CM | POA: Diagnosis not present

## 2023-12-01 DIAGNOSIS — M47816 Spondylosis without myelopathy or radiculopathy, lumbar region: Secondary | ICD-10-CM | POA: Diagnosis not present

## 2023-12-02 DIAGNOSIS — Z720 Tobacco use: Secondary | ICD-10-CM | POA: Diagnosis not present

## 2023-12-02 DIAGNOSIS — I10 Essential (primary) hypertension: Secondary | ICD-10-CM | POA: Diagnosis not present

## 2023-12-02 DIAGNOSIS — M5442 Lumbago with sciatica, left side: Secondary | ICD-10-CM | POA: Diagnosis not present

## 2023-12-06 ENCOUNTER — Telehealth: Payer: Self-pay

## 2023-12-06 NOTE — Telephone Encounter (Signed)
 Called Radiology and requested PET be read.

## 2023-12-07 ENCOUNTER — Other Ambulatory Visit: Payer: Self-pay

## 2023-12-07 DIAGNOSIS — Z122 Encounter for screening for malignant neoplasm of respiratory organs: Secondary | ICD-10-CM

## 2023-12-07 DIAGNOSIS — Z87891 Personal history of nicotine dependence: Secondary | ICD-10-CM

## 2023-12-07 DIAGNOSIS — R911 Solitary pulmonary nodule: Secondary | ICD-10-CM

## 2023-12-09 ENCOUNTER — Telehealth: Payer: Self-pay | Admitting: Acute Care

## 2023-12-09 ENCOUNTER — Other Ambulatory Visit: Payer: Self-pay

## 2023-12-09 NOTE — Telephone Encounter (Signed)
 Called and spoke with patient and spouse. Reviewed recent PET results. Advised patient the PET scan was indeterminate and that this 14 mm nodule could be granulation tissue, or post infection/ inflammation, or a slow growing tumor. Patient is in agreement to repeat scan in 4 months. Follow up scan order placed. Results and plan to PCP.

## 2023-12-09 NOTE — Telephone Encounter (Signed)
 Ladies, can we call patient and let him know PET scan was indeterminate and that this nodule could be granulation tissue ( New connective tissue with tiny blood vessels that develop at a wound site in the process of healing. They grow from the base of the wound and help to fill in the wound as it heals. ) , or post infection/ inflammation  , or a slow growing tumor. Plan is for a 4 month follow up LDCT to re-evaluate the area. This will be due after 04/06/2024.  Please order  follow up scan. I have added PCP to the message to keep him in the loop.  Thanks so much. Dr. Darlyn Read, please let me know if you have any questions or concerns.   14 mm right upper lobe nodule demonstrates low level FDG uptake of 1.31. Indeterminate finding. This could represent granulation tissue or postinfectious process or low grade tumor. Recommend follow-up CT scan in 4-6 months. 2. No enlarged or hypermetabolic mediastinal or hilar lymph nodes. 3. No findings for metastatic disease involving the abdomen/pelvis or bony structures. 4. Stable right adrenal gland adenoma. 5. Advanced vascular disease.

## 2023-12-10 ENCOUNTER — Ambulatory Visit: Admitting: Acute Care

## 2023-12-13 ENCOUNTER — Other Ambulatory Visit: Payer: Self-pay | Admitting: Family Medicine

## 2023-12-13 DIAGNOSIS — I10 Essential (primary) hypertension: Secondary | ICD-10-CM

## 2023-12-13 MED ORDER — METOPROLOL SUCCINATE ER 100 MG PO TB24: 100.0000 mg | ORAL_TABLET | Freq: Every day | ORAL | 0 refills | Status: DC

## 2023-12-13 NOTE — Addendum Note (Signed)
 Addended by: Julious Payer D on: 12/13/2023 11:39 AM   Modules accepted: Orders

## 2023-12-13 NOTE — Telephone Encounter (Signed)
 [facility] pt NTBS 30-d given 09/13/24

## 2023-12-13 NOTE — Telephone Encounter (Signed)
 Patient has appt 12-29-2023 with [facility]

## 2023-12-29 ENCOUNTER — Ambulatory Visit: Admitting: Family Medicine

## 2023-12-29 ENCOUNTER — Telehealth: Payer: Self-pay

## 2023-12-29 NOTE — Telephone Encounter (Signed)
 Lm on vm to reschedule apt. [facility] dod on Monday. LS

## 2024-01-05 ENCOUNTER — Ambulatory Visit: Admitting: Family Medicine

## 2024-01-06 ENCOUNTER — Ambulatory Visit (INDEPENDENT_AMBULATORY_CARE_PROVIDER_SITE_OTHER): Admitting: Family Medicine

## 2024-01-06 VITALS — BP 138/78 | HR 79 | Temp 98.4°F | Ht 71.0 in | Wt 177.4 lb

## 2024-01-06 DIAGNOSIS — E785 Hyperlipidemia, unspecified: Secondary | ICD-10-CM | POA: Diagnosis not present

## 2024-01-06 DIAGNOSIS — N529 Male erectile dysfunction, unspecified: Secondary | ICD-10-CM | POA: Diagnosis not present

## 2024-01-06 DIAGNOSIS — N401 Enlarged prostate with lower urinary tract symptoms: Secondary | ICD-10-CM | POA: Diagnosis not present

## 2024-01-06 DIAGNOSIS — I10 Essential (primary) hypertension: Secondary | ICD-10-CM

## 2024-01-06 DIAGNOSIS — R3914 Feeling of incomplete bladder emptying: Secondary | ICD-10-CM

## 2024-01-06 LAB — CBC WITH DIFFERENTIAL/PLATELET

## 2024-01-06 LAB — LIPID PANEL

## 2024-01-06 MED ORDER — ROSUVASTATIN CALCIUM 20 MG PO TABS: 20.0000 mg | ORAL_TABLET | Freq: Every day | ORAL | 3 refills | Status: DC

## 2024-01-06 MED ORDER — OMEPRAZOLE 20 MG PO CPDR: 20.0000 mg | DELAYED_RELEASE_CAPSULE | Freq: Every day | ORAL | 3 refills | Status: AC

## 2024-01-06 MED ORDER — METOPROLOL SUCCINATE ER 100 MG PO TB24: 100.0000 mg | ORAL_TABLET | Freq: Every day | ORAL | 0 refills | Status: DC

## 2024-01-06 MED ORDER — NABUMETONE 500 MG PO TABS: 1000.0000 mg | ORAL_TABLET | Freq: Two times a day (BID) | ORAL | 3 refills | Status: AC

## 2024-01-06 MED ORDER — FLUTICASONE PROPIONATE 50 MCG/ACT NA SUSP: 2.0000 | Freq: Every day | NASAL | 6 refills | Status: AC

## 2024-01-06 MED ORDER — TADALAFIL 20 MG PO TABS: 20.0000 mg | ORAL_TABLET | ORAL | 5 refills | Status: DC | PRN

## 2024-01-06 MED ORDER — NABUMETONE 500 MG PO TABS: 1000.0000 mg | ORAL_TABLET | Freq: Two times a day (BID) | ORAL | 3 refills | Status: DC

## 2024-01-06 MED ORDER — TADALAFIL 20 MG PO TABS: 20.0000 mg | ORAL_TABLET | ORAL | 5 refills | Status: AC | PRN

## 2024-01-06 MED ORDER — SILDENAFIL CITRATE 20 MG PO TABS: ORAL_TABLET | ORAL | 3 refills | Status: DC

## 2024-01-06 MED ORDER — METOPROLOL SUCCINATE ER 100 MG PO TB24: 100.0000 mg | ORAL_TABLET | Freq: Every day | ORAL | 3 refills | Status: AC

## 2024-01-06 NOTE — Progress Notes (Signed)
 Subjective:  Patient ID: Jerry Gill, male    DOB: 02-19-1961  Age: 63 y.o. MRN: [id number]  CC: Medical Management of Chronic Issues   HPI Jerry Gill presents for  follow-up of hypertension. Patient has no history of headache chest pain or shortness of breath or recent cough. Patient also denies symptoms of TIA such as focal numbness or weakness. Patient denies side effects from medication. States taking it regularly.  Patient in for follow-up of GERD. Currently asymptomatic taking  PPI PRN. There is no chest pain or heartburn. No hematemesis and no melena. No dysphagia or choking. Onset is remote. Progression is stable. Complicating factors, none.    History Jerry Gill has a past medical history of AAA (abdominal aortic aneurysm) (HCC), Collagen vascular disease (HCC), DDD (degenerative disc disease), lumbar, and Hypertension.   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   His family history includes AAA (abdominal aortic aneurysm) in his father; Cancer in his father; Heart disease in his father and mother; Hyperlipidemia in his brother; Hypertension in his mother.He reports that he has been smoking cigarettes. He has a 23.5 pack-year smoking history. He has never used smokeless tobacco. He reports that he does not drink alcohol and does not use drugs.  Current Outpatient Medications on File Prior to Visit  Medication Sig Dispense Refill   albuterol  (VENTOLIN  HFA) 108 (90 Base) MCG/ACT inhaler Inhale 2 puffs into the lungs every 4 (four) hours as needed. 18 g 1   aspirin  EC 81 MG tablet Take 81 mg by mouth every evening.     Budeson-Glycopyrrol-Formoterol (BREZTRI  AEROSPHERE) 160-9-4.8 MCG/ACT AERO Inhale 2 puffs into the lungs 2 (two) times daily. 10.7 g 11   clobetasol  (TEMOVATE ) 0.05 % external solution Apply 1 application topically 2 (two) times daily. 50 mL 11   fexofenadine   (ALLEGRA ) 180 MG tablet Take 1 tablet (180 mg total) by mouth daily. For allergy symptoms 30 tablet 11   fluticasone  (FLONASE ) 50 MCG/ACT nasal spray Place 1 spray into both nostrils daily as needed for allergies or rhinitis. (Needs to be seen before next refill) 16 g 0   Multiple Vitamins-Minerals (MULTIVITAMIN GUMMIES MENS PO) Take 1 tablet by mouth 2 (two) times daily.     tiZANidine  (ZANAFLEX ) 4 MG tablet Take 1 tablet (4 mg total) by mouth every 6 (six) hours as needed for muscle spasms. 30 tablet 1   No current facility-administered medications on file prior to visit.    ROS Review of Systems  Constitutional: Negative.   HENT: Negative.    Eyes:  Negative for visual disturbance.  Respiratory:  Negative for cough and shortness of breath.   Cardiovascular:  Negative for chest pain and leg swelling.  Gastrointestinal:  Negative for abdominal pain, diarrhea, nausea and vomiting.  Genitourinary:  Positive for frequency. Negative for difficulty urinating.  Musculoskeletal:  Negative for arthralgias and myalgias.  Skin:  Negative for rash.  Neurological:  Negative for headaches.  Psychiatric/Behavioral:  Negative for sleep disturbance.     Objective:  BP 138/78 Comment: at home reading per pt  Pulse 79   Temp 98.4 F (36.9 C) (Temporal)   Ht 5\' 11"  (1.803 m)   Wt 177 lb 6.4 oz (80.5 kg)   SpO2 96%   BMI 24.74 kg/m   BP Readings from Last 3 Encounters:  01/06/24 138/78  04/08/23 123/73  12/29/22 133/82    Wt Readings from Last 3 Encounters:  01/06/24 177 lb 6.4 oz (80.5 kg)  04/08/23 180 lb (81.6 kg)  12/29/22 182 lb 3.2 oz (82.6 kg)     Physical Exam Vitals reviewed.  Constitutional:      Appearance: He is well-developed.  HENT:     Head: Normocephalic and atraumatic.     Right Ear: External ear normal.     Left Ear: External ear normal.     Mouth/Throat:     Pharynx: No oropharyngeal exudate or posterior oropharyngeal erythema.  Eyes:     Pupils: Pupils are  equal, round, and reactive to light.  Cardiovascular:     Rate and Rhythm: Normal rate and regular rhythm.     Heart sounds: No murmur heard. Pulmonary:     Effort: No respiratory distress.     Breath sounds: Normal breath sounds.  Musculoskeletal:     Cervical back: Normal range of motion and neck supple.  Neurological:     Mental Status: He is alert and oriented to person, place, and time.       Assessment & Plan:  Benign prostatic hyperplasia with incomplete bladder emptying -     CBC with Differential/Platelet -     CMP14+EGFR -     Lipid panel  Erectile dysfunction, unspecified erectile dysfunction type -     CBC with Differential/Platelet -     CMP14+EGFR -     Lipid panel  Essential hypertension -     Metoprolol  Succinate ER; Take 1 tablet (100 mg total) by mouth daily.  Dispense: 90 tablet; Refill: 3 -     CBC with Differential/Platelet -     CMP14+EGFR -     Lipid panel  Dyslipidemia -     Lipid panel  Other orders -     Omeprazole ; Take 1 capsule (20 mg total) by mouth daily. For reflux  Dispense: 90 capsule; Refill: 3 -     Fluticasone  Propionate; Place 2 sprays into both nostrils daily.  Dispense: 16 g; Refill: 6 -     Nabumetone ; Take 2 tablets (1,000 mg total) by mouth 2 (two) times daily. For muscle and joint pain  Dispense: 360 tablet; Refill: 3 -     Tadalafil ; Take 1 tablet (20 mg total) by mouth every other day as needed for erectile dysfunction.  Dispense: 30 tablet; Refill: 5    Allergies as of 01/06/2024       Reactions   Crestor  [rosuvastatin ] Other (See Comments)   myalgia   Levaquin  [levofloxacin ]    nausea   Codeine Nausea And Vomiting   Penicillins Itching, Rash   Has patient had a PCN reaction causing immediate rash, facial/tongue/throat swelling, SOB or lightheadedness with hypotension: Unknown, childhood reaction Has patient had a PCN reaction causing severe rash involving mucus membranes or skin necrosis: No Has patient had a PCN  reaction that required hospitalization No Has patient had a PCN reaction occurring within the last 10 years: No If all of the above answers are "NO", then may proceed with Cephalosporin use.   Sulfa Antibiotics Itching        Medication List        Accurate as of January 06, 2024 11:59 PM. If you have any questions, ask your nurse or doctor.          STOP taking these medications    rosuvastatin  20 MG tablet Commonly known as: Crestor    sildenafil  20 MG tablet Commonly known as: REVATIO        TAKE these medications  albuterol  108 (90 Base) MCG/ACT inhaler Commonly known as: VENTOLIN  HFA Inhale 2 puffs into the lungs every 4 (four) hours as needed.   aspirin  EC 81 MG tablet Take 81 mg by mouth every evening.   Breztri  Aerosphere 160-9-4.8 MCG/ACT Aero inhaler Generic drug: budeson-glycopyrrolate-formoterol Inhale 2 puffs into the lungs 2 (two) times daily.   clobetasol  0.05 % external solution Commonly known as: TEMOVATE  Apply 1 application topically 2 (two) times daily.   fexofenadine  180 MG tablet Commonly known as: ALLEGRA  Take 1 tablet (180 mg total) by mouth daily. For allergy symptoms   fluticasone  50 MCG/ACT nasal spray Commonly known as: FLONASE  Place 1 spray into both nostrils daily as needed for allergies or rhinitis. (Needs to be seen before next refill)   fluticasone  50 MCG/ACT nasal spray Commonly known as: FLONASE  Place 2 sprays into both nostrils daily.   metoprolol  succinate 100 MG 24 hr tablet Commonly known as: TOPROL -XL Take 1 tablet (100 mg total) by mouth daily.   MULTIVITAMIN GUMMIES MENS PO Take 1 tablet by mouth 2 (two) times daily.   nabumetone  500 MG tablet Commonly known as: RELAFEN  Take 2 tablets (1,000 mg total) by mouth 2 (two) times daily. For muscle and joint pain   omeprazole  20 MG capsule Commonly known as: PRILOSEC Take 1 capsule (20 mg total) by mouth daily. For reflux   tadalafil  20 MG tablet Commonly known  as: CIALIS  Take 1 tablet (20 mg total) by mouth every other day as needed for erectile dysfunction.   tiZANidine  4 MG tablet Commonly known as: ZANAFLEX  Take 1 tablet (4 mg total) by mouth every 6 (six) hours as needed for muscle spasms.         Follow-up: Return in about 6 months (around 07/07/2024).  Roise Cleaver, M.D.

## 2024-01-07 LAB — CMP14+EGFR
ALT: 13 IU/L (ref 0–44)
AST: 12 IU/L (ref 0–40)
Albumin: 4.6 g/dL (ref 3.9–4.9)
Alkaline Phosphatase: 70 IU/L (ref 44–121)
BUN/Creatinine Ratio: 9 — ABNORMAL LOW (ref 10–24)
BUN: 11 mg/dL (ref 8–27)
Bilirubin Total: 0.3 mg/dL (ref 0.0–1.2)
CO2: 22 mmol/L (ref 20–29)
Calcium: 9.2 mg/dL (ref 8.6–10.2)
Chloride: 104 mmol/L (ref 96–106)
Creatinine, Ser: 1.16 mg/dL (ref 0.76–1.27)
Globulin, Total: 2.3 g/dL (ref 1.5–4.5)
Glucose: 90 mg/dL (ref 70–99)
Potassium: 4.6 mmol/L (ref 3.5–5.2)
Sodium: 139 mmol/L (ref 134–144)
Total Protein: 6.9 g/dL (ref 6.0–8.5)
eGFR: 70 mL/min/{1.73_m2} (ref >59)

## 2024-01-07 LAB — LIPID PANEL
Cholesterol, Total: 190 mg/dL (ref 100–199)
HDL: 26 mg/dL — ABNORMAL LOW (ref >39)
LDL CALC COMMENT:: 7.3 ratio — ABNORMAL HIGH (ref 0.0–5.0)
LDL Chol Calc (NIH): 128 mg/dL — ABNORMAL HIGH (ref 0–99)
Triglycerides: 198 mg/dL — ABNORMAL HIGH (ref 0–149)
VLDL Cholesterol Cal: 36 mg/dL (ref 5–40)

## 2024-01-07 LAB — CBC WITH DIFFERENTIAL/PLATELET
Basos: 0 %
EOS (ABSOLUTE): 0 10*3/uL (ref 0.0–0.2)
Eos: 2 %
Hematocrit: 46.4 % (ref 37.5–51.0)
Hemoglobin: 15.6 g/dL (ref 13.0–17.7)
Immature Granulocytes: 0 %
Immature Granulocytes: 0 10*3/uL (ref 0.0–0.1)
Lymphs: 32 %
MCH: 30.8 pg (ref 26.6–33.0)
MCHC: 33.6 g/dL (ref 31.5–35.7)
MCV: 92 fL (ref 79–97)
Monocytes Absolute: 0.1 10*3/uL (ref 0.0–0.4)
Monocytes Absolute: 0.5 10*3/uL (ref 0.1–0.9)
Monocytes: 8 %
Neutrophils Absolute: 2 10*3/uL (ref 0.7–3.1)
Neutrophils Absolute: 3.7 10*3/uL (ref 1.4–7.0)
Neutrophils: 58 %
Platelets: 268 10*3/uL (ref 150–450)
RBC: 5.06 x10E6/uL (ref 4.14–5.80)
RDW: 12.7 % (ref 11.6–15.4)
WBC: 6.4 10*3/uL (ref 3.4–10.8)

## 2024-01-09 ENCOUNTER — Encounter: Payer: Self-pay | Admitting: Family Medicine

## 2024-02-17 ENCOUNTER — Ambulatory Visit: Payer: Medicare HMO

## 2024-03-06 DIAGNOSIS — S46912A Strain of unspecified muscle, fascia and tendon at shoulder and upper arm level, left arm, initial encounter: Secondary | ICD-10-CM | POA: Diagnosis not present

## 2024-03-06 DIAGNOSIS — M79602 Pain in left arm: Secondary | ICD-10-CM | POA: Diagnosis not present

## 2024-03-06 DIAGNOSIS — I10 Essential (primary) hypertension: Secondary | ICD-10-CM | POA: Diagnosis not present

## 2024-03-06 DIAGNOSIS — Y93F9 Activity, other caregiving: Secondary | ICD-10-CM | POA: Diagnosis not present

## 2024-03-06 DIAGNOSIS — X500XXA Overexertion from strenuous movement or load, initial encounter: Secondary | ICD-10-CM | POA: Diagnosis not present

## 2024-03-06 DIAGNOSIS — M25512 Pain in left shoulder: Secondary | ICD-10-CM | POA: Diagnosis not present

## 2024-03-06 DIAGNOSIS — Z720 Tobacco use: Secondary | ICD-10-CM | POA: Diagnosis not present

## 2024-03-06 DIAGNOSIS — Y929 Unspecified place or not applicable: Secondary | ICD-10-CM | POA: Diagnosis not present

## 2024-03-07 DIAGNOSIS — S46912A Strain of unspecified muscle, fascia and tendon at shoulder and upper arm level, left arm, initial encounter: Secondary | ICD-10-CM | POA: Diagnosis not present

## 2024-03-07 DIAGNOSIS — M79602 Pain in left arm: Secondary | ICD-10-CM | POA: Diagnosis not present

## 2024-03-07 DIAGNOSIS — Y93F9 Activity, other caregiving: Secondary | ICD-10-CM | POA: Diagnosis not present

## 2024-03-07 DIAGNOSIS — I10 Essential (primary) hypertension: Secondary | ICD-10-CM | POA: Diagnosis not present

## 2024-03-07 DIAGNOSIS — Y929 Unspecified place or not applicable: Secondary | ICD-10-CM | POA: Diagnosis not present

## 2024-03-07 DIAGNOSIS — X500XXA Overexertion from strenuous movement or load, initial encounter: Secondary | ICD-10-CM | POA: Diagnosis not present

## 2024-03-07 DIAGNOSIS — M25512 Pain in left shoulder: Secondary | ICD-10-CM | POA: Diagnosis not present

## 2024-03-07 DIAGNOSIS — Z720 Tobacco use: Secondary | ICD-10-CM | POA: Diagnosis not present

## 2024-03-13 ENCOUNTER — Ambulatory Visit (HOSPITAL_COMMUNITY)

## 2024-03-16 ENCOUNTER — Encounter: Payer: Self-pay | Admitting: Acute Care

## 2024-03-20 ENCOUNTER — Telehealth: Payer: Self-pay

## 2024-03-20 NOTE — Telephone Encounter (Signed)
 Spoke with patient. 4 month F/U CT scheduled.

## 2024-03-20 NOTE — Telephone Encounter (Signed)
-----   Message from Nurse Isaiah SQUIBB sent at 12/09/2023  3:07 PM EDT ----- Regarding: 4 Month CT Confirm 4 month CT scheduled/completed. Due 04/07/2024

## 2024-04-07 ENCOUNTER — Ambulatory Visit (HOSPITAL_COMMUNITY)

## 2024-04-26 DIAGNOSIS — Z1152 Encounter for screening for COVID-19: Secondary | ICD-10-CM | POA: Diagnosis not present

## 2024-04-26 DIAGNOSIS — Z720 Tobacco use: Secondary | ICD-10-CM | POA: Diagnosis not present

## 2024-04-26 DIAGNOSIS — I10 Essential (primary) hypertension: Secondary | ICD-10-CM | POA: Diagnosis not present

## 2024-04-26 DIAGNOSIS — J019 Acute sinusitis, unspecified: Secondary | ICD-10-CM | POA: Diagnosis not present

## 2024-04-26 DIAGNOSIS — R519 Headache, unspecified: Secondary | ICD-10-CM | POA: Diagnosis not present

## 2024-05-04 ENCOUNTER — Ambulatory Visit (HOSPITAL_COMMUNITY): Admission: RE | Admit: 2024-05-04 | Source: Ambulatory Visit

## 2024-05-12 ENCOUNTER — Ambulatory Visit (HOSPITAL_COMMUNITY): Admission: RE | Admit: 2024-05-12 | Source: Ambulatory Visit

## 2024-05-16 ENCOUNTER — Telehealth: Payer: Self-pay

## 2024-05-16 NOTE — Telephone Encounter (Signed)
 Pt cancelled 05/12/2024 scan. Advises his mother in-law is doing bad health wise and is worse than last week. Advises he had to cancel the scan. Request call back in 2 weeks. Reminder set.

## 2024-05-30 NOTE — Telephone Encounter (Signed)
 Spoke with patient. 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 He is aware that this follow up scan is needed due to the indeterminate PET scan. Pt request call back in 2-3 weeks. Patient also has our contact information to schedule his CT if he is able to schedule a scan.

## 2024-06-20 NOTE — Telephone Encounter (Signed)
 Jerry Gill  We attempted to call patient multiple times since July to schedule his 4 month follow up CT due 04/07/2024, 4 months after his PET scan. He has delayed and cancelled every appt made. The last few calls the patient states he cannot schedule due to taking care of his mother in law who has declining health. Pt's yearly scan is due 11/2024. Pt states he will complete an annual Lung CT next year. Advises he is not worried about the results. He is aware that this scan is to monitor a 14 mm nodule and exclude cancer. Pt request call back when annual scan is due.   Isaiah, RN

## 2024-06-27 NOTE — Telephone Encounter (Signed)
 Pt is aware there is a risk involved with missing recommended scan and declines imaging. Pt seems to be getting irritated that we continue to call in attempts to schedule follow up scan. Advised we will follow up in a few months, earlier 2026 for annual Lung CT per patient request.

## 2024-07-29 DIAGNOSIS — J019 Acute sinusitis, unspecified: Secondary | ICD-10-CM | POA: Diagnosis not present

## 2024-07-29 DIAGNOSIS — R059 Cough, unspecified: Secondary | ICD-10-CM | POA: Diagnosis not present

## 2024-07-29 DIAGNOSIS — Z720 Tobacco use: Secondary | ICD-10-CM | POA: Diagnosis not present

## 2024-07-29 DIAGNOSIS — R0989 Other specified symptoms and signs involving the circulatory and respiratory systems: Secondary | ICD-10-CM | POA: Diagnosis not present

## 2024-07-29 DIAGNOSIS — Z1152 Encounter for screening for COVID-19: Secondary | ICD-10-CM | POA: Diagnosis not present

## 2024-07-29 DIAGNOSIS — I10 Essential (primary) hypertension: Secondary | ICD-10-CM | POA: Diagnosis not present
